# Patient Record
Sex: Female | Born: 1935 | Race: White | Hispanic: No | Smoking: Never smoker
Health system: Southern US, Community
[De-identification: ages and names within clinical notes are randomized; demographics above are authoritative.]

## PROBLEM LIST (undated history)

## (undated) DIAGNOSIS — R011 Cardiac murmur, unspecified: Secondary | ICD-10-CM

## (undated) DIAGNOSIS — R058 Other specified cough: Secondary | ICD-10-CM

## (undated) DIAGNOSIS — I8390 Asymptomatic varicose veins of unspecified lower extremity: Secondary | ICD-10-CM

## (undated) DIAGNOSIS — J42 Unspecified chronic bronchitis: Secondary | ICD-10-CM

## (undated) DIAGNOSIS — M1990 Unspecified osteoarthritis, unspecified site: Secondary | ICD-10-CM

## (undated) DIAGNOSIS — Z8679 Personal history of other diseases of the circulatory system: Secondary | ICD-10-CM

## (undated) DIAGNOSIS — N183 Chronic kidney disease, stage 3 unspecified: Secondary | ICD-10-CM

## (undated) DIAGNOSIS — G518 Other disorders of facial nerve: Secondary | ICD-10-CM

## (undated) DIAGNOSIS — I10 Essential (primary) hypertension: Secondary | ICD-10-CM

## (undated) DIAGNOSIS — E210 Primary hyperparathyroidism: Secondary | ICD-10-CM

## (undated) DIAGNOSIS — H3530 Unspecified macular degeneration: Secondary | ICD-10-CM

## (undated) DIAGNOSIS — I482 Chronic atrial fibrillation, unspecified: Secondary | ICD-10-CM

## (undated) DIAGNOSIS — E039 Hypothyroidism, unspecified: Secondary | ICD-10-CM

## (undated) DIAGNOSIS — K219 Gastro-esophageal reflux disease without esophagitis: Secondary | ICD-10-CM

## (undated) DIAGNOSIS — M810 Age-related osteoporosis without current pathological fracture: Secondary | ICD-10-CM

## (undated) DIAGNOSIS — J209 Acute bronchitis, unspecified: Secondary | ICD-10-CM

## (undated) DIAGNOSIS — E041 Nontoxic single thyroid nodule: Secondary | ICD-10-CM

## (undated) DIAGNOSIS — R05 Cough: Secondary | ICD-10-CM

## (undated) DIAGNOSIS — I739 Peripheral vascular disease, unspecified: Secondary | ICD-10-CM

## (undated) DIAGNOSIS — I493 Ventricular premature depolarization: Secondary | ICD-10-CM

## (undated) DIAGNOSIS — Z86718 Personal history of other venous thrombosis and embolism: Secondary | ICD-10-CM

## (undated) DIAGNOSIS — M419 Scoliosis, unspecified: Secondary | ICD-10-CM

## (undated) DIAGNOSIS — K635 Polyp of colon: Secondary | ICD-10-CM

## (undated) HISTORY — PX: REVERSE SHOULDER ARTHROPLASTY: SHX5054

## (undated) HISTORY — DX: Essential (primary) hypertension: I10

## (undated) HISTORY — PX: TOTAL HIP ARTHROPLASTY: SHX124

## (undated) HISTORY — DX: Nontoxic single thyroid nodule: E04.1

## (undated) HISTORY — DX: Scoliosis, unspecified: M41.9

## (undated) HISTORY — DX: Other disorders of facial nerve: G51.8

## (undated) HISTORY — DX: Asymptomatic varicose veins of unspecified lower extremity: I83.90

## (undated) HISTORY — DX: Hypothyroidism, unspecified: E03.9

## (undated) HISTORY — PX: CATARACT EXTRACTION, BILATERAL: SHX1313

## (undated) HISTORY — DX: Chronic atrial fibrillation, unspecified: I48.20

## (undated) HISTORY — DX: Ventricular premature depolarization: I49.3

## (undated) HISTORY — DX: Personal history of other diseases of the circulatory system: Z86.79

## (undated) HISTORY — DX: Polyp of colon: K63.5

## (undated) HISTORY — DX: Primary hyperparathyroidism: E21.0

## (undated) HISTORY — DX: Age-related osteoporosis without current pathological fracture: M81.0

## (undated) HISTORY — DX: Gastro-esophageal reflux disease without esophagitis: K21.9

## (undated) HISTORY — DX: Unspecified macular degeneration: H35.30

---

## 1993-12-13 ENCOUNTER — Encounter: Payer: Self-pay | Admitting: Gastroenterology

## 1995-09-02 ENCOUNTER — Encounter: Payer: Self-pay | Admitting: Gastroenterology

## 1998-01-10 ENCOUNTER — Other Ambulatory Visit: Admission: RE | Admit: 1998-01-10 | Discharge: 1998-01-10 | Payer: Self-pay | Admitting: Obstetrics and Gynecology

## 1998-03-31 ENCOUNTER — Emergency Department (HOSPITAL_COMMUNITY): Admission: EM | Admit: 1998-03-31 | Discharge: 1998-03-31 | Payer: Self-pay | Admitting: Emergency Medicine

## 1998-04-07 ENCOUNTER — Emergency Department (HOSPITAL_COMMUNITY): Admission: EM | Admit: 1998-04-07 | Discharge: 1998-04-07 | Payer: Self-pay | Admitting: Emergency Medicine

## 1999-01-17 ENCOUNTER — Other Ambulatory Visit: Admission: RE | Admit: 1999-01-17 | Discharge: 1999-01-17 | Payer: Self-pay | Admitting: Obstetrics and Gynecology

## 1999-02-28 ENCOUNTER — Encounter: Payer: Self-pay | Admitting: Orthopedic Surgery

## 1999-03-01 ENCOUNTER — Encounter: Payer: Self-pay | Admitting: Orthopedic Surgery

## 1999-03-01 ENCOUNTER — Inpatient Hospital Stay (HOSPITAL_COMMUNITY): Admission: RE | Admit: 1999-03-01 | Discharge: 1999-03-06 | Payer: Self-pay | Admitting: Orthopedic Surgery

## 2000-12-10 ENCOUNTER — Encounter (INDEPENDENT_AMBULATORY_CARE_PROVIDER_SITE_OTHER): Payer: Self-pay | Admitting: *Deleted

## 2000-12-10 ENCOUNTER — Ambulatory Visit (HOSPITAL_COMMUNITY): Admission: RE | Admit: 2000-12-10 | Discharge: 2000-12-10 | Payer: Self-pay | Admitting: *Deleted

## 2002-02-19 ENCOUNTER — Other Ambulatory Visit: Admission: RE | Admit: 2002-02-19 | Discharge: 2002-02-19 | Payer: Self-pay | Admitting: Obstetrics and Gynecology

## 2003-10-20 ENCOUNTER — Encounter: Admission: RE | Admit: 2003-10-20 | Discharge: 2003-10-20 | Payer: Self-pay | Admitting: Orthopedic Surgery

## 2003-12-09 ENCOUNTER — Ambulatory Visit (HOSPITAL_COMMUNITY): Admission: RE | Admit: 2003-12-09 | Discharge: 2003-12-09 | Payer: Self-pay | Admitting: Orthopedic Surgery

## 2004-05-18 ENCOUNTER — Encounter: Admission: RE | Admit: 2004-05-18 | Discharge: 2004-05-18 | Payer: Self-pay | Admitting: Cardiology

## 2004-06-05 ENCOUNTER — Ambulatory Visit (HOSPITAL_COMMUNITY): Admission: RE | Admit: 2004-06-05 | Discharge: 2004-06-05 | Payer: Self-pay | Admitting: Cardiology

## 2005-02-19 ENCOUNTER — Encounter: Admission: RE | Admit: 2005-02-19 | Discharge: 2005-02-19 | Payer: Self-pay | Admitting: Internal Medicine

## 2005-03-28 ENCOUNTER — Other Ambulatory Visit: Admission: RE | Admit: 2005-03-28 | Discharge: 2005-03-28 | Payer: Self-pay | Admitting: Obstetrics and Gynecology

## 2005-07-25 ENCOUNTER — Encounter: Admission: RE | Admit: 2005-07-25 | Discharge: 2005-07-25 | Payer: Self-pay | Admitting: Internal Medicine

## 2005-08-01 ENCOUNTER — Encounter: Admission: RE | Admit: 2005-08-01 | Discharge: 2005-08-01 | Payer: Self-pay | Admitting: Orthopedic Surgery

## 2005-08-09 ENCOUNTER — Inpatient Hospital Stay (HOSPITAL_COMMUNITY): Admission: RE | Admit: 2005-08-09 | Discharge: 2005-08-14 | Payer: Self-pay | Admitting: Orthopedic Surgery

## 2005-09-02 ENCOUNTER — Ambulatory Visit: Payer: Self-pay | Admitting: Infectious Diseases

## 2005-09-02 ENCOUNTER — Inpatient Hospital Stay (HOSPITAL_COMMUNITY): Admission: EM | Admit: 2005-09-02 | Discharge: 2005-09-11 | Payer: Self-pay | Admitting: Orthopedic Surgery

## 2005-09-07 ENCOUNTER — Encounter: Payer: Self-pay | Admitting: Vascular Surgery

## 2005-10-23 ENCOUNTER — Ambulatory Visit: Payer: Self-pay | Admitting: Internal Medicine

## 2005-10-30 ENCOUNTER — Inpatient Hospital Stay (HOSPITAL_COMMUNITY): Admission: RE | Admit: 2005-10-30 | Discharge: 2005-11-06 | Payer: Self-pay | Admitting: Orthopedic Surgery

## 2005-10-30 ENCOUNTER — Encounter (INDEPENDENT_AMBULATORY_CARE_PROVIDER_SITE_OTHER): Payer: Self-pay | Admitting: Specialist

## 2005-11-04 ENCOUNTER — Encounter (INDEPENDENT_AMBULATORY_CARE_PROVIDER_SITE_OTHER): Payer: Self-pay | Admitting: Interventional Cardiology

## 2007-01-28 ENCOUNTER — Ambulatory Visit (HOSPITAL_COMMUNITY): Admission: RE | Admit: 2007-01-28 | Discharge: 2007-01-28 | Payer: Self-pay | Admitting: Endocrinology

## 2008-01-19 DIAGNOSIS — K219 Gastro-esophageal reflux disease without esophagitis: Secondary | ICD-10-CM

## 2008-01-19 DIAGNOSIS — M810 Age-related osteoporosis without current pathological fracture: Secondary | ICD-10-CM | POA: Insufficient documentation

## 2008-01-20 ENCOUNTER — Ambulatory Visit: Payer: Self-pay | Admitting: Gastroenterology

## 2008-01-20 DIAGNOSIS — R198 Other specified symptoms and signs involving the digestive system and abdomen: Secondary | ICD-10-CM

## 2008-01-20 DIAGNOSIS — K5900 Constipation, unspecified: Secondary | ICD-10-CM

## 2008-01-20 DIAGNOSIS — K921 Melena: Secondary | ICD-10-CM

## 2008-02-02 ENCOUNTER — Ambulatory Visit (HOSPITAL_COMMUNITY): Admission: RE | Admit: 2008-02-02 | Discharge: 2008-02-02 | Payer: Self-pay | Admitting: Endocrinology

## 2008-02-24 ENCOUNTER — Ambulatory Visit: Payer: Self-pay | Admitting: Gastroenterology

## 2009-03-14 ENCOUNTER — Ambulatory Visit (HOSPITAL_COMMUNITY): Admission: RE | Admit: 2009-03-14 | Discharge: 2009-03-14 | Payer: Self-pay | Admitting: Endocrinology

## 2010-02-18 DIAGNOSIS — E210 Primary hyperparathyroidism: Secondary | ICD-10-CM

## 2010-02-18 HISTORY — DX: Primary hyperparathyroidism: E21.0

## 2010-03-16 ENCOUNTER — Ambulatory Visit (HOSPITAL_COMMUNITY)
Admission: RE | Admit: 2010-03-16 | Discharge: 2010-03-16 | Payer: Self-pay | Source: Home / Self Care | Attending: Endocrinology | Admitting: Endocrinology

## 2010-07-06 NOTE — Op Note (Signed)
[name], [name]                ACCOUNT NO.:  [id number]   MEDICAL RECORD NO.:  [id number]          PATIENT TYPE:  INP   LOCATION:  1420                         FACILITY:  [facility]   PHYSICIAN:  Ollen Gross, M.D.    DATE OF BIRTH:  04/13/1923   DATE OF PROCEDURE:  10/30/2005  DATE OF DISCHARGE:                                 OPERATIVE REPORT   PREOPERATIVE DIAGNOSIS:  Right total hip arthroplasty infection, status post  right hip resection arthroplasty.   POSTOPERATIVE DIAGNOSIS:  Right total hip arthroplasty infection, status  post right hip resection arthroplasty.   PROCEDURE:  Right total hip arthroplasty reimplantation.   SURGEON:  Ollen Gross, M.D.   ASSISTANT:  [name], P.A.   ANESTHESIA:  General.   ESTIMATED BLOOD LOSS:  1200.   DRAIN:  Hemovac x1.   REPLACEMENT:  450, Cell Saver.   COMPLICATIONS:  None.   CONDITION:  Stable to recovery.   BRIEF CLINICAL NOTE:  Ms. Loeb is an 77 year old female who has had  multiple problems in regards to her right hip.  She had a late total hip  arthroplasty infection many years postop.  Underwent resection arthroplasty  approximately 8 years ago.  She had placement of antibiotic spacer.  She has  been on IV vancomycin and oral ciprofloxacin.  She has had excellent healing  of her wound and sedimentation rate and C-reactive protein normalized after  being extremely highly elevated preoperatively.  She presents now for total  hip arthroplasty reimplantation.   PROCEDURE IN DETAIL:  After successful initiation of general anesthetic, the  patient is placed in the left lateral decubitus position with the right side  up and held with the hip positioner.  The right lower extremity is isolated  from her perineum with plastic drapes and prepped and draped in the usual  sterile fashion.  A long posterolateral incision was made with a 10 blade  through subcutaneous tissue to the level of the fascia lata, which  is  incised in line with the skin incision.  There is some hematoma present.  This fluid is sent for stat Gram stain, C&S, which ended up being negative.  The fascia lata is incised and some hematoma fluid is there, which was sent.  This is also negative.  The fascia of the vastus lateralis is incised and  the muscles elevated to show the lateral cortex of the femur.  I was able to  remove the PROSTALAC femoral component.  There was segmental defect in the  bone with significant cortical bone loss from when we did the resection  arthroplasty.  I then was able to remove the acetabular liner and the cement  from the acetabular component.  There were no acetabular defects.  We began  reaming at 47 mm, coursing in increments of two up to 57 mm, and a 56 mm  Pinnacle acetabular shell was placed in anatomic position with excellent  purchase and we had 3 additional dome screws placed, also with excellent  purchase.  The trial 36 mm neutral +4 liner is placed.   On  the femoral side, she did have a periprosthetic fracture in the interim  between the resection and this case.  There is a lot of bone that had  formed.  I excised the extra bone that would not be needed.  We identified  the distal fragment and entered the femoral canal, subsequent thoroughly  irrigating the canal.  The reaming is then performed up to 14.5 mm for  placement of a 15 mm bowed Solution stem.  I placed the trial stem,  impacting it to the appropriate level, and then placed a 36 +0 head.  We  reduced the hip with great stability.  It was felt that this would be our  best reconstructive option on the femur.  Other considerations were for a  complete proximal femoral allograft, but I felt as though the Solution would  provide our best option for this.  We subsequently opened the Solution bowed  8-inch 15 mm stem and impacted it into the femur in approximately 20 degrees  of anteversion.  We got down to the level I felt most  appropriate and placed  a trial 36 +0 head.  We went to a 36 +5 to gain our best soft tissue  tension.  There was fantastic stability with full extension, full external  rotation, 70 degrees flexion, 40 degrees adduction, 90 degrees internal  rotation and 90 degrees flexion, 70 degrees internal rotation.  By placing  the right leg on top of the left, we were within a few millimeters of  equalizing her leg length.  She was about three-quarters of an inch short  preoperatively.  I palpated the sciatic nerve, and there was no evidence of  any excess tension on the nerve.  We then took an x-ray AP and lateral to  confirm that we had excellent purchase within the femoral canal and make  sure there were no fractures.  The x-rays came back with no fractures and  with excellent canal fill.  Subsequently placed the 36 +5 head and reduced  the hip.   The greater trochanteric fragment is then reduced onto the stem.  There was  bone loss medial and lateral.  I decided in order to reattach the  trochanteric fragment with the trochanteric plate that I had to put a strut  allograft just to serve as a means to reattach the trochanter.  I fashioned  2 strut allografts to the femur and then we impacted the Zimmer trochanteric  plate onto the greater trochanter and advanced the trochanter.  One cable  was placed proximally to cable the plate to the trochanter, tibial plate  placed distally to keep the fragment on the distal aspect.  These were  placed around the allograft struts.  There was excellent purchase noted.  There was minimal movement in this construct with rotation.   The wound was then copiously irrigated with saline solution.  The fascia of  the vastus lateralis was closed with running #1 Vicryl.  The pseudocapsule  posterior is reattached to the femur with Ethibond suture.  Fascia lata is  closed over a Hemovac drain with #1 Vicryl, subcu closed with #1 and #2-0 Vicryl and skin closed with  staples.  The drain [place] hooked to suction,  incision cleaned and dried and a bulky sterile dressing applied.  She is  placed into a knee immobilizer, awakened and transported to recovery in  stable condition.      Ollen Gross, M.D.  Electronically Signed     FA/MEDQ  D:  10/30/2005  T:  10/31/2005  Job:  [id number]

## 2010-07-06 NOTE — H&P (Signed)
[name], [name]                ACCOUNT NO.:  [id number]   MEDICAL RECORD NO.:  [id number]          PATIENT TYPE:  INP   LOCATION:  1509                         FACILITY:  [facility]   PHYSICIAN:  Ollen Gross, M.D.    DATE OF BIRTH:  [birth year]   DATE OF ADMISSION:  09/02/2005  DATE OF DISCHARGE:                                HISTORY & PHYSICAL   CHIEF COMPLAINT:  Right hip infection with drainage.   HISTORY OF PRESENT ILLNESS:  The patient is an 77 year old female who is  well known to Dr. Ollen Gross and had previously undergone an I&D of a  right hip infected bursa back in June 2007.  It was found that the infection  was felt to be fairly deep.  Therefore, she was put on PICC line and IV  antibiotics.  During her postoperative course she has been following in the  office and found to have a small area at the base of her incision which  started out as an erythematous region and then eventually opened up and had  wound dehiscence with drainage.  Due to the lack of improvement and also  wound dehiscence with infection, it is felt the patient would best be served  by undergoing I&D and right hip resection arthroplasty.  The patient was  subsequently admitted to the hospital.  Please note, all the cultures have  been followed from the previous I&D surgery have failed to show any organism  at this time.   ALLERGIES:  1. PENICILLIN causes welts.  2. SULFA causes welts.  3. RELAFEN and LODINE cause welts.  4. Local reaction to PNEUMOVAX.   CURRENT MEDICATIONS:  1. Synthroid 100 mcg daily.  2. Aspirin 325 mg daily.  3. Percocet one or two every 4-6 hours as needed for pain.  4. She is on multivitamin; also, vitamin C, vitamin B12, vitamin D.  5. She is also on IV vancomycin every-other day and p.o. rifampin.   PAST MEDICAL HISTORY:  1. Reflux.  2. Osteoporosis.  3. History of pneumonia.  4. Hypothyroidism.  5. Peripheral edema.  6. Stenosis.  7. History of irregular heart  rate.  8. Lumbar scoliosis.  9. Varicose veins.  10.History of blood transfusion.   PAST SURGICAL HISTORY:  1. Recent I&D of the right hip back in June 2007.  2. Left total hip arthroplasty.  3. Right total hip arthroplasty.  4. Bilateral eyelid surgery.  5. Colonoscopy.   SOCIAL HISTORY:  Denies use of tobacco products or alcohol products.   FAMILY HISTORY:  Father deceased with history of lung cancer.  Mother  deceased with history of stroke and Alzheimer's.   REVIEW OF SYSTEMS:  GENERAL:  She has not had any fevers, chills, or night  sweats.  NEURO:  No seizure, syncope, or paralysis.  RESPIRATORY:  No  shortness of breath, productive cough, or hemoptysis.  CARDIOVASCULAR:  No  chest pain, angina, orthopnea.  GI:  No nausea, vomiting, diarrhea,  constipation.  GU:  No dysuria, hematuria, discharge.  MUSCULOSKELETAL:  Right hip wound which shows wound dehiscence at the tip  of the incision, the  distal portion, with serosanguineous drainage.   PHYSICAL EXAMINATION:  VITAL SIGNS:  Shows temperature of 98.3, pulse 68,  respirations 16, blood pressure 127/67.  GENERAL:  An 77 year old female, well-nourished, well-developed, no acute  distress.  She is alert and oriented and cooperative.  She is accompanied by  her family, seen in a hospital bed at [facility].  Very pleasant,  soft-spoken.  She is accompanied by her daughter, her minister, and her  granddaughter-in-law.  HEENT:  Normocephalic, atraumatic.  Pupils equal, round, and reactive.  Oropharynx clear.  EOMs intact.  NECK:  Supple.  CHEST:  Clear anterior and posterior chest walls.  No rhonchi, rales, or  wheezing.  HEART:  Regular rhythm with occasional ectopic beat.  S1, S2 noted.  No  rubs, thrills, palpitations, or murmurs.  ABDOMEN:  Soft, nontender, bowel sounds present.  RECTAL, BREAST, GENITALIA:  Not done, not pertinent to present illness.  EXTREMITIES:  Right hip:  Right hip incision, the upper  two-thirds is well  healed.  However, the lower third of the incision has some wound dehiscence  with some surrounding erythema.  Incision opening is about one-and-a-half  inches long.  Some serosanguineous drainage noted.  Motor intact to the  right lower extremity.  Sensation intact.   IMPRESSION:  1. Infected right total hip.  2. Reflux.  3. Osteoporosis.  4. History of pneumonia.  5. Hypothyroidism.  6. Peripheral edema.  7. Stenosis.  8. History of irregular heart rate.  9. Lumbar scoliosis.  10.History of varicose veins.  11.History of blood transfusion.   PLAN:  The patient admitted to [facility], continued on her IV  antibiotics.  Dr. Maurice March will be notified of her readmission.  Tentative  surgery in the next couple of days.  Plan for right hip resection  arthroplasty and placement of antibiotic spacer.      [name], P.A.      Ollen Gross, M.D.  Electronically Signed    ALP/MEDQ  D:  09/03/2005  T:  09/03/2005  Job:  [id number]   cc:   [name], M.D.  Fax: [phone]   [name], M.D.  Fax: [phone]

## 2010-07-06 NOTE — Discharge Summary (Signed)
[name], [name]                ACCOUNT NO.:  [id number]   MEDICAL RECORD NO.:  [id number]          PATIENT TYPE:  INP   LOCATION:  1509                         FACILITY:  [facility]   PHYSICIAN:  Ollen Gross, M.D.    DATE OF BIRTH:  [birth year]   DATE OF ADMISSION:  09/02/2005  DATE OF DISCHARGE:  09/11/2005                                 DISCHARGE SUMMARY   ADMISSION DIAGNOSES:  1. Infected right total hip.  2. Reflux.  3. Osteoporosis.  4. History of pneumonia.  5. Hypothyroidism.  6. Peripheral edema.  7. Stenosis.  8. History of irregular heart rate.  9. Lumbar scoliosis.  10.History of varicose veins.  11.History of blood transfusion.   DISCHARGE DIAGNOSES:  1. Infected right total hip, status post right hip resection arthroplasty      with placement of antibiotic-impregnated spacer.  2. Acute blood loss anemia.  3. Status post transfusion without sequelae.  4. Postoperative hypokalemia, improved.  5. Reflux.  6. Osteoporosis.  7. History of pneumonia.  8. Hypothyroidism.  9. Peripheral edema.  10.Stenosis.  11.History of irregular heart rate.  12.Lumbar scoliosis.  13.History of varicose veins.   PROCEDURE:  On September 04, 2005, right hip resection arthroplasty with  placement of antibiotic-impregnated spacer.  Surgeon was Dr. Homero Fellers Aluisio  assisted by Alexzandrew L. Perkins, P.A.-C.  Intraoperative transfusion of 3  units of packed cells.   CONSULTATIONS:  Infectious disease, Lacretia Leigh. Ninetta Lights, M.D.   HISTORY OF PRESENT ILLNESS:  [name] is an 77 year old female with an  infected right total hip arthroplasty.  She has had one debridement but has  continued hip drainage and now presents for resection arthroplasty and  placement of antibiotic-impregnated spacer.   LABORATORY DATA:  Preoperative CBC showed a hemoglobin of 9.6, hematocrit  29.4 (she had recently been in surgery).  She was given blood  intraoperatively.  Postoperative hemoglobin up to 11.3  but unfortunately  drifted back down to 7.4 after surgery.  She was given blood again with a  post-transfusion hemoglobin of 10.4 which drifted down to 9.4.  Last H&H 9.3  and 27.9.  White count on admission was normal, and it stayed within normal  limits.  Sed rate on postoperatively was normal at 18.  PT/PTT on admission  14.6 and 34, respectively.  INR 1.1.  Serial protimes followed.  Last PT/PTT  19.5 and 1.6.  BMET on admission within normal limits.  Serial BMETs were  followed.  Potassium did drop from 4.0 to 3.2 and back up to 4.3.  Glucose  went up from 89 to 170 and back down to 93.  Amylase taken on September 05, 2005  was normal at 125, lipase low at 18.  C-reactive protein elevated at 3.0.  Vancomycin trough less than __________  .  Postoperative UA with trace  hemoglobin, trace leukocyte esterase, rare epithelials, 0-2 red, 0-2 white.  Followup UA with moderate hemoglobin, small leukocyte esterase, rare  epithelials, 3-6 white, 0-2 red.  Blood cultures x1 with no growth after  five days.  Fluid culture from the right hip showed  gram smear with no  organisms, no growth.  Stat gram stain at the time of surgery with no  organisms, no WBCs.  Urine culture x2, first on September 06, 2005 with no growth  and again on September 10, 2005 with no growth.  Anaerobic culture taken at the  time of surgery:  Smear showed no organism, no WBCs.  Culture with no  anaerobes isolated.   Venous Doppler on September 07, 2005, bilateral lower extremity duplex, with no  evidence of DVT, no superficial thrombosis or Baker's cyst.  EKG on September 08, 2005 revealed sinus rhythm with sinus arrhythmia and occasional premature  ventricular complexes; otherwise, normal, unconfirmed.  Abdominal view on  September 05, 2005 with nonspecific bowel gas pattern.   HOSPITAL COURSE:  The patient was admitted to [facility] for  draining hip.  Infectious disease was consulted.  The patient was seen by  Dr. Ninetta Lights and started on  vancomycin.  Rifampin was added along with Cipro.  Sed rate and C-reactive protein was checked.  Sed rate was normal.  C-  reactive protein was slightly elevated.  She was given blood preoperatively  on hospital day #2.  She tolerated the blood well.  It came up.  The initial  cultures before showed no organisms.  New culture and specimens were taken  at the time of surgery.  She was set up and she was kept at bedrest.  She  was still on vancomycin from her previous surgery, and Rifampin and Cipro  was added.  Hemoglobin came up to 11.3.  Her INR was normal.  She was made  n.p.o. and taken to the operating room on September 04, 2005 and above the above-  stated procedure without complications.  Did have an extensive resection,  and she did require blood, 3 more units, during the procedure.  She was  later transferred back up to the floor.  She was hypotensive on day #1, felt  to be volume and blood loss.  Monitored her pressure.  Hemoglobin was down  to 7.4.  Drain placed at the time of surgery was left in.  She was given  more blood.  Did have some fever postoperative which was followed very  closely.  Blood cultures and urine cultures were checked.  Blood cultures  proved to be negative also along with urine cultures.  Hemoglobin came back  up after blood again to 9.8.  Dressing change on day #2, incision looked  good.  Due to the extensive resection and the placement of the antibiotic  spacer, a hip abduction brace was ordered to provide added support.  [employer]  was notified, and a hip abduction brace was placed on the patient.  Once the  brace was placed, she was allowed to be up.  ID followed very closely.  She  did have some leg pain noted after surgery.  Venous Doppler was ordered  which proved to be negative.  Urine cultures remained negative.  She was  allowed to be up, touchdown weightbearing.  Discharge planning was  consulted.  By day #3 and day #4, the fevers were improving.  By day  #4, she was afebrile with no obvious source for infection.  They recommended to  continue the vancomycin and Cipro.  Discharge planning started making  arrangements.  On September 09, 2005, she noticed some fluttering with her  heart.  EKG was checked, as above.  Her Foley was left in due to the  extensive surgery  and only being touchdown weightbearing.  She was able to  go home.  It was recommended that she have the brace on while in bed.  She  may have the brace off for hygiene.  The fluttering did resolve.  By September 10, 2005, she was doing much better.  She was not getting much sleep, but  she did get up with therapy the day before and walked about 35 feet.  Discharge planning was making arrangements for home.  She had a little bit  of low potassium, so we added potassium and that improved back up to 4.3. By  September 11, 2005, she was doing well.  Arrangements were made at home.  There  was no more flutter, and she was discharged.   DISPOSITION:  The patient was discharged home on September 11, 2005.   DISCHARGE MEDICATIONS:  1. Nu-Iron.  2. Percocet.  3. Robaxin.  4. Coumadin.  5. Vancomycin.  6. Cipro.   DIET:  Resume current diet.   FOLLOW UP:  The patient is to follow up next Tuesday, call for appointment.   ACTIVITY:  Home health and physical therapy nursing.  Touchdown  weightbearing with hip precautions.  Abduction brace when out of bed.  She  can wear the knee immobilizer in bed.   CONDITION ON DISCHARGE:  Stable.      [name], P.A.      Ollen Gross, M.D.  Electronically Signed    ALP/MEDQ  D:  10/24/2005  T:  10/24/2005  Job:  [id number]   cc:   Ollen Gross, M.D.  Fax: [phone]   Fransisco Hertz, M.D.  Fax: [phone]   Darius Bump, M.D.  Fax: [phone]   Lacretia Leigh. Ninetta Lights, M.D.  Fax: [phone]

## 2010-07-06 NOTE — Op Note (Signed)
[name], [name]                ACCOUNT NO.:  [id number]   MEDICAL RECORD NO.:  [id number]          PATIENT TYPE:  INP   LOCATION:  1509                         FACILITY:  [facility]   PHYSICIAN:  Ollen Gross, M.D.    DATE OF BIRTH:  September 29, 1923   DATE OF PROCEDURE:  09/04/2005  DATE OF DISCHARGE:                                 OPERATIVE REPORT   PREOPERATIVE DIAGNOSIS:  Infected right total hip arthroplasty.   POSTOPERATIVE DIAGNOSIS:  Infected right total hip arthroplasty.   PROCEDURE:  Right hip resection arthroplasty with placement of antibiotic-  impregnated spacer.   SURGEON:  Ollen Gross, M.D.   ASSISTANT:  [name], P.A.   ANESTHESIA:  General.   ESTIMATED BLOOD LOSS:  2000.   REPLACEMENT:  Three units of packed red blood cells and note that hemoglobin  was 11.5 near end of the case.   DRAIN:  Hemovac x1.   COMPLICATIONS:  None.   DISPOSITION:  To Recovery.   BRIEF CLINICAL NOTE:  Ms. Clauson is an 77 year old female with an infected  right total hip arthroplasty.  She has had 1 debridement, but has continued  hip drainage.  She presents now for resection arthroplasty with antibiotic  spacer placement.   PROCEDURE IN DETAIL:  After successful administration of a general  anesthetic, the patient was placed in the left lateral decubitus position  with the right side up and held with a hip positioner.  The right lower  extremity was isolated from her perineum with plastic drapes and prepped and  draped in the usual sterile fashion.  A long posterolateral incision is made  with a 10 blade through subcutaneous tissue to the fascia lata.  There was a  hole in the fascia with purulent drainage coming through it.  We sent this  for Gram's stain, C&S and aerobic and anaerobic cultures.  I incised the  fascia lata.  All abnormal-appearing tissue in the subcu as well as around  the fascia lata is removed.  There was a lot of hypertrophic granulation,  all  of which is removed to normal tissue.  Sciatic nerve is palpated and  protected.  The pseudocapsule was excised off the femur.  Hips was then  dislocated.  The femoral head is removed.  I inspected the femoral stem.  It  is extremely solid.  Utilizing the flexible osteotomes, I attempted to  disrupt the interface between the bone and the porous-coated portion of the  stem.  I was able to do so.  I then attempted to extract the stem and the  stem would not budge at all.  In fact, the bone stock proximally was rather  weak and there was a crack in the greater trochanter at this point.  I  subsequently decided to hold on the femur and proceed with the acetabulum.  The femur was then retracted anteriorly to gain acetabular exposure.  The  pseudocapsule and all tissue around the polyethylene had been removed.  The  polyethylene was then removed with an osteotome.  The 2 dome screws are  removed.  Using  the combination of half-inch curved osteotomes to disrupt  the edges of the interface between the bone and the prosthesis and then  using the Morland cementless osteotomes, I was able to extract the cup with  no bone loss.  We then used a curette to thoroughly remove any debris from  the cup.  I then packed it with a sponge.  I again addressed the femur.  Given that this was not moving and that the trochanter had already cracked  off, I decided to go with an extended osteotomy down the shaft.   We extended the incision distally and then cut through the fascia lata and  fascia to the vastus lateralis and then exposed the shaft of the femur.  The  drill holes were placed anterior and posterior and then I drilled down  distally until we had passed the tip of the stem.  A saw and osteotome were  used to elevate this lateral fragment.  This exposed the extent of the stem.  Although it was not porous-coated distally, the flutes of the stem all had  bony growth around them.  I carefully tried to remove  the bone from the  flutes, but her bone stock was rather weak distally and several pieces  fractured off.  We then were able to remove the stem.  I had pieced all of  the fragments back together and repaired them with multiple Dall-Miles  cables.  The construct was felt to be stable, but once again the  trochanteric fragment was not connected to the shaft fragment.   We then prepared the femur for placement of a size 1 Prostalac stem with  normal offset.  I then mixed 2 bags of Palacos cement with a gram of  vancomycin and 1.2 g of tobramycin.  We put this into the acetabulum and  cemented the Prostalac liner in placed.  It was a 42 with 32 inner diameter.  When the cement hardened, we mixed the cement for the stem.  I molded it  around the stem and then placed the stem into the canal with about 20  degrees of anteversion.  We put it at the appropriate depth in the canal.  Once again, the trochanteric landmarks were not present, so we impacted the  stem to a point where it would allow for good tension when we reduced the  hip.  Once the cement fully hardened, we placed a 32 +9 head and reduced it.  She had very good stability at the time.  We then thoroughly irrigated with  pulsatile lavage and removed any other abnormal-appearing tissue.  The  fascia of the vastus lateralis was then closed with a running #1 Vicryl.  We  then reattached the trochanter back by its soft tissue.  I did not want to  put those cables proximally, as we did not have good bone stock and I did  not want to destroy the remaining bone.  The fascia lata was then closed  over a Hemovac drain with #1 Vicryl, subcu closed #1 and 2-0 Vicryl and skin  with staples.  Drain was hooked to suction, incision cleaned and dried and a  bulky sterile dressing applied.  She was placed into a knee immobilizer,  awakened, and transported to Recovery in stable condition.      Ollen Gross, M.D. Electronically Signed      FA/MEDQ  D:  09/04/2005  T:  09/05/2005  Job:  [phone]
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
## 2010-07-06 NOTE — Discharge Summary (Signed)
[name], [name]                ACCOUNT NO.:  [id number]   MEDICAL RECORD NO.:  [id number]          PATIENT TYPE:  INP   LOCATION:  1615                         FACILITY:  [facility]   PHYSICIAN:  Ollen Gross, M.D.    DATE OF BIRTH:  16-Aug-1923   DATE OF ADMISSION:  08/09/2005  DATE OF DISCHARGE:  08/14/2005                                 DISCHARGE SUMMARY   __________   ADMISSION DIAGNOSES:  1. Infected bursa right hip.  2. Infected THA    History of irregular heart rate.  History of lumbar scoliosis.   DISCHARGE DIAGNOSES:  1. Infected right total hip, status post irrigation and debridement right      hip.  2. Infected THA.  Postoperative acute blood loss anemia.  Postoperative transfusion without sequelae.   PROCEDURE:  August 09, 2005.  Irrigation and debridement right hip.  Surgeon  Dr. Lequita Halt.  Assistant Arlyn Leak, PA-C.  Anesthesia general.   CONSULTATIONS:  Infectious Disease.   HISTORY:  [name] is a 77 year old female who underwent a right total hip  arthroplasty approximately 6 years ago.  Over the past few years she has had  pain.  She has had an intensive work up including aspirations that all have  come back negative.  She had a recent onset of severe pain over the past  month, elevated sed rate, C-reactive protein.  She was seen in the office  for the first time and sent to radiology for aspiration which did not reveal  any fluid, aspirated about 30 cc of purulent fluid the day before admission.  There were no hospital beds available that day.  She was admitted the  following day for surgery.   LABORATORY DATA:  Preoperative CBC with hemoglobin 10.6, hematocrit 32.3,  white cell count 7.5.  Postoperative hemoglobin down to 9.2 then 7.6, given  blood.  Post transfusion hemoglobin back up to 9.8 and hematocrit 29.8.  Pro  Time and PTT on admission 14.4 and 27 respectively. INR was 1.1.  BMET on  admission within normal limits.  Serial BMET's were  followed.  All  electrolytes stayed within normal limits.  Blood group/type A positive.  Wound culture on August 09, 2005 smear showed no organisms, moderate white  blood cells, no growth.  Anaerobic culture August 09, 2005 smear showed no  organisms, moderate white blood cells, no anaerobes isolated.  AFB culture  smear no acid fast bacilli seen.   CLINICAL DATA:  EKG August 09, 2005 sinus rhythm, premature supraventricular  complexes with occasional nonconsecutive premature ventricular complexes.  No previous EKG's.  Performed by Dr. Dietrich Pates.  Portable chest August 09, 2005 PICC to distal SVC.   HOSPITAL COURSE:  The patient was admitted to [facility] and taken  to the operating room and underwent the above-stated procedure without  complications.  The patient tolerated the procedure well.  She was started  on PCA and IV vancomycin per protocol and started on a daily aspirin.  She  was given p.o. and IV analgesics.  She was seen in rounds on day #1.  Hemovac drain output was still 85 per shift.  We would keep it in until it  was less than 10 cc.  Hemovac drain remained.  Recommended continued  antibiotics.  Infectious Disease was consulted.  The patient was seen in  consultation by ID.  Cultures were reviewed and all the cultures so far have  been negative.  Recommended adding Rifampin to the current vancomycin  protocol.  It was noted hemoglobin had dropped on August 11, 2005 down to 7.8.  Recommended transfusion and patient was given 2 units of blood.  Patient  tolerated the blood well and felt much better.  On the following day, August 12, 2005, cultures still remained negative.  Hemoglobin had come up to 9.8  after 2 units. Hemovac drain was left in place.  She had been weaned over to  p.o. medications and the PCA was discontinued at that time.  This was also  when the Rifampin was added, 300 b.i.d. on top of vancomycin.  The dressing  was changed on day #2.  The incision looked  good.  We left the drain in.  She was sent for a PICC line.  The PICC line was placed, confirmed by chest  x-ray.  She was recommended vancomycin 1500 mg every 48 hours for six weeks.  She would be on it for 35 more days. She was seen on rounds on August 13, 2005, doing well.  Incision looked good.  Drain had come out the evening  before.  She was tolerating her med's and wanted to go home.  Arrangements  were made for IV home care and she was discharged on August 13, 2005.   PLAN:  Patient is discharged on August 13, 2005.   DISCHARGE DIAGNOSES:  Please see above.   DISCHARGE MEDICATIONS:  Vancomycin 1500 mg every 48 hours over the next 35  days, 17 doses.  Next dose is day after discharge.   DIET:  As tolerated.   FOLLOWUP:  She is to followup in the office next Tuesday, August 20, 2005 for  wound check.   ACTIVITY:  Up as tolerated. Weight-bearing as tolerated.   DISPOSITION:  Home.   CONDITION ON DISCHARGE:  Improved postoperatively but stable and guarded  from infection standpoint.      [name], P.A.      Ollen Gross, M.D.  Electronically Signed    ALP/MEDQ  D:  09/24/2005  T:  09/24/2005  Job:  [id number]

## 2010-07-06 NOTE — Consult Note (Signed)
NAMEMARGEL, JOENS NO.:  [id number]   MEDICAL RECORD NO.:  [id number]          PATIENT TYPE:  INP   LOCATION:  1416                         [facility]   PHYSICIAN:  Ulyses Amor, MD DATE OF BIRTH:  01-08-1924   DATE OF CONSULTATION:  11/03/2005  DATE OF DISCHARGE:                                   CONSULTATION   [name] is an 77 year old white woman who has been hospitalized at  [facility] following resection of a right hip  arthroplasty due to infection.  A cardiology consultation was requested  because of periods of irregular heartbeat.   Of note, the patient has no past history of cardiac disease.  She does have  a history of irregular heartbeat.  She reports that she underwent cardiac  catheterization in August of 2006 for this problem and was found to not have  any coronary artery disease.  She was seen by Dr. Julio Sicks earlier during  her stay for periods of bradycardia.  Her rhythm was sinus, but had dropped  into the 40's.  No significant problem was identified.  She is also known to  have PVC's.   The patient is completely asymptomatic at this time.  She reports no chest  pain, tightness, heaviness, pressure, or squeezing.  Nor is there is any  dyspnea, diaphoresis, or nausea.  She does note brief periods of a rapid  heartbeat, but this causes her no problem.  She reports no dizziness,  lightheadedness, syncope, or near syncope.   The patient has a history of hypothyroidism and lumbar scoliosis.   ALLERGIES:  PENICILLIN, SULFA, RELAFEN, LODINE, PNEUMOVAX.   MEDICATIONS:  1. Warfarin.  2. Colace.  3. Levothyroxine.  4. Oxycodone/APAP.  5. Metoclopramide.  6. Methocarbamol.   SOCIAL HISTORY:  The patient neither smokes cigarettes, nor drinks alcohol.   FAMILY HISTORY:  Notable for lung cancer.   REVIEW OF SYSTEMS:  No new problems related to her head, eyes, ears, nose,  mouth, throat, lungs,  gastrointestinal system, genitourinary system, or  extremities.  There is no history of neurologic or psychiatric disorder.  No  history of fever, chills, or weight loss.   PHYSICAL EXAMINATION:  VITAL SIGNS:  Blood pressure 144/78, pulse 69 and  regular, respirations 16, temperature 98.7.  The patient's daughter was  present throughout the patient's encounter.  GENERAL:  The patient was an elderly white woman in no discomfort.  She was  alert, oriented, appropriate, and responsive.  HEENT:  Head, eyes, nose, and mouth were normal.  NECK:  Without thyromegaly or adenopathy.  Carotid pulses were palpable  bilaterally and without bruits.  HEART:  Normal S1 and S2.  There was no S3, S4, murmur, rub, or click.  Cardiac rhythm was regular.  No chest wall tenderness was noted.  LUNGS:  Clear.  ABDOMEN:  Soft and nontender.  There was no mass, hepatosplenomegaly,  bruits, distention, rebound, guarding, or rigidity.  Bowel sounds were  normal.  BREASTS:  PELVIC:  RECTAL:  Not performed as they were not pertinent to the  reason for  acute care hospitalization.  EXTREMITIES:  Examination of the right lower extremity was limited due to  the dressings.  Radial and dorsalis pedal pulses were palpable bilaterally.  NEUROLOGY:  Brief screening neurology survey was unremarkable.   Review of the rhythm strips revealed an underlying sinus rhythm with  occasional PVC's and occasional couplets.  There were runs, lasting several  minutes at a time, of atrial fibrillation with a rapid ventricular response.  These resolved spontaneously and the patient was returned to normal sinus  rhythm.  The 12-lead electrocardiogram revealed sinus rhythm with occasional  and consecutive PVC's.  The tracing was otherwise normal.   IMPRESSION:  Paroxysmal atrial fibrillation with rapid ventricular response.   RECOMMENDATIONS:  1. Serial cardiac enzymes.  2. Metoprolol.  3. Thyroid function tests.  4. Echocardiogram.   5. Additional medications to suppress atrial fibrillation.  6. Maintain potassium between 4 and 5.  7. Armanda Magic, M.D. will follow with her in the morning.      Ulyses Amor, MD  Electronically Signed     MSC/MEDQ  D:  11/04/2005  T:  11/04/2005  Job:  [id number]   cc:   Armanda Magic, M.D.  Fax: [phone]

## 2010-07-06 NOTE — Discharge Summary (Signed)
[name], [name]                ACCOUNT NO.:  [id number]   MEDICAL RECORD NO.:  [id number]          PATIENT TYPE:  INP   LOCATION:  1416                         FACILITY:  [facility]   PHYSICIAN:  Ollen Gross, M.D.    DATE OF BIRTH:  1923/07/31   DATE OF ADMISSION:  10/30/2005  DATE OF DISCHARGE:  11/06/2005                                 DISCHARGE SUMMARY   ADMISSION DIAGNOSES:  1. Right hip resection arthroplasty.  2. Reflux.  3. Osteoporosis.  4. History of pneumonia.  5. Hypothyroidism.  6. Peripheral edema.  7. Stenosis.  8. History of irregular heart rate flutter.  9. Lumbar scoliosis.  10.Varicose veins.  11.Previous history of transfusion.   DISCHARGE DIAGNOSES:  1. Right hip resection arthroplasty, status post reimplantation of right      total hip.  2. Postoperative atrial fibrillation with a rapid ventricular response.  3. Postoperative acute blood loss anemia, status post transfusion without      sequelae.  4. Postoperative hypokalemia, improved.  5. Postoperative bradycardia, improved.  6. Reflux.  7. Osteoporosis.  8. History of pneumonia.  9. Hypothyroidism.  10.Peripheral edema.  11.Stenosis.  12.History of irregular heart rate flutter.  13.Lumbar scoliosis.  14.Varicose veins.  15.Previous history of transfusion.   PROCEDURE:  On October 30, 2005, status post right total hip arthroplasty  reimplantation, surgeon Ollen Gross, M.D., assistant Alexzandrew L.  Otho Darner., anesthesia general.   CONSULTATIONS:  1. Jackie Plum, M.D., [facility].  2. Cardiology, Armanda Magic, M.D.   BRIEF HISTORY:  [name] is an 77 year old female who previously underwent  a resection arthroplasty about 6 weeks ago.  Has been treated with IV  antibiotics and now presents for reimplantation of her total knee.   LABORATORY DATA:  Preop CBC:  Hemoglobin 13.0, hematocrit 39.2, white cell  count 4.8, red cell count 4.34.  Serial CBCs were followed.   White count did  go up to 13.2, back down to 4.2.  Hemoglobin dropped postoperatively down to  7.3.  Given blood.  Post-transfusion hemoglobin back down to 9.2.  It  drifted down again, 8.3, and then again to 7.8, given blood again.  Post  transfusion back up to 9.4.  Last noted H&H 11.0 and 31.9.  PT/PTT preop  14.1 and 32, respectively, INR 1.1.  Serum pro times followed.  Last noted  PT/INR 28.2 and 2.5.  Chemistry panel on admission:  Low total protein of  5.7, elevated ALP of 166.  Remaining chemistry panel on admission all within  normal limits.  Serial BMETs were followed.  Serum potassium followed from  4.6 to 3.3, back to 4.1.  Calcium dropped from 10.2 to 8.2, back up to 8.9.  Cardiac enzymes in 5 sets taken over 3 days.  On November 01, 2005, first  set:  CK 39, CK-MB 0.5, troponin 0.02; second set taken on November 02, 2005, showed CK 31, CK-MB 0.6, troponin 0.03; third set taken on November 02, 2005, CK 30, CK-MB 0.6, troponin 0.02; fourth set taken on November 03, 2005, CK 17, CK-MB 0.7, troponin  0.02; the last set taken on November 04, 2005, CK 13, CK-MB 0.6, troponin 0.02.  TSH level taken on November 01, 2005, elevated at 5.855.  T4 and T3 taken on November 03, 2005, 6.4 and  77.2.  TSH level checked again on September 17, still elevated at 5.852.  Preop UA:  Trace leukocyte esterase, many epithelials, 0-2 white cells, rare  bacteria.  Follow-up UA on September 17:  Small leukocyte esterase, 0-2  white cells, otherwise negative.  Blood group type A positive.  Stat Gram  stain taken on October 30, 2005, at the time of surgery noted wbc's, no  organism seen.  Urine culture:  No growth.  Wound culture taken at time of  surgery:  Smear showed no organisms.  Culture:  No growth x2 days.  Anaerobic culture:  Smear showed no organisms.  Anaerobic culture showed no  anaerobes isolated.   Echo:  Overall left ventricular systolic function was normal.  Left   ventricular ejection fraction estimated at 65%.  No diagnostic evidence of  left ventricular regional wall motion.  There was trivial aortic valvular  regurgitation.  Inferior vena cava was mildly dilated.  EKGs:  EKG dated  November 01, 2005:  Marked sinus bradycardia, frequent consecutive  premature ventricular complexes, possible premature atrial complexes,  aberrant conduction, no significant change was found.  Previous EKG  confirmed by Dr. Charlton Haws.  Preop EKG September 08, 2005:  Sinus rhythm with  sinus arrhythmia with occasional premature ventricular complexes.  No change  was found when compared to August 09, 2005, confirmed by Dr. Graceann Congress.  Repeat EKG November 03, 2005:  Sinus arrhythmia with occasional and  consecutive premature complexes, possible premature atrial complexes,  aberrant conduction, no significant change was found.  Another EKG November 03, 2005:  Atrial fibrillation with rapid ventricular response and premature  aberrant conduction complexes, abnormal, unconfirmed.   HOSPITAL COURSE:  The patient was admitted to [facility] and  tolerated the procedure well, later returned to the recovery room and the  orthopedics floor.  Started on PCA and p.o. analgesia for pain control  following surgery.  Continued her IV antibiotics.  Was started on Coumadin  for DVT prophylaxis.  Did pretty well with surgery and was in some pain but  doing well on the morning of day #1.  Unfortunately, the hemoglobin through  the night was low at 7.3 and did receive blood.  Labs were pending that  morning.  Started on PCA and p.o. analgesics.  Started getting up out of  bed.  By day #2 she was doing pretty well, looking more comfortable.  Hemoglobin had come up following the transfusion to 9.2 but it was down a  little bit the next morning at 8.3.  dressing was changed and the incision  looked good.  Hemovac still had some output.  Rechecked the H&H at 2 o'clock.  Did have  a little superficial ulceration on her lateral right  thigh, covered with a Tegaderm.  Follow-up H&H was low, down to 7.8.  Recommended blood.  Given Cepacol lozenges.  Later that evening on day #2  the patient experienced some bradycardia down to 42.  A consult was called  to [facility].  They were seen in consultation by Dr. Julio Sicks.  Receiving blood, TSH and EKG was checked.  EKG showed sinus rhythm,  arrhythmia, back up to 89 with some PVCs.  A low-dose beta blocker was  started.  On the  morning of day #3 the patient was receiving blood and  getting her third unit.  She did have some runs of some fast beats and also  the bradycardia was noted from the night before.  The drain still had some  output, so we left the drain in for now.  Left the Foley in at that time,  heparin-locked the fluids.  She had been transferred down to telemetry.  Cardiac enzymes were checked as above.  Cardiac enzymes were negative.  By  day #4 she was doing well, no complaints of chest pain or shortness of  breath.  The Hemovac drain continued with a fair amount of output, 130 mL  over the past 24 hours, therefore was left in.  The wound was healing well.  Unfortunately, on the evening of day #4 the patient had developed some  atrial fibrillation with rapid response.  Covering services were notified  along with Dr. Julio Sicks.  Cardiac consult was called in.  Found to be in  some paroxysmal atrial fibrillation with rapid ventricular rate.  Labs were  ordered along with medications and it was under better control after  treatment.  By the next morning, postop day #5, pulse was well-controlled.  She was still on telemetry.  Rapid response had ended.  Foley was  discontinued also along with the fluids.  Continued with dressing changes.  The incision looked good.  The Hemovac drain was pulled at that time.  From  the therapy standpoint, the patient was initially slow to progress with  therapy because of  different issues with her heart.  Once these were under  control she started getting up better with therapy.  A 2 D echo was ordered  as above.  Negative cardiac enzymes.  Potassium was a little low.  It was  repleted with potassium supplements.  Encouraged to keep her potassium above  the level of 4.  Hypokalemia had resolved.  By day #6 pulse had been steady.  Started progressing with her physical therapy once she was feeling better,  drainage had stopped, and the incision looked very good.  Chest x-ray had  been checked and showed chronic changes with no edema.  It was felt the fact  that she was progressing with her therapy, feeling better, she would be  ready to go home the next day.  Seen in rounds on November 06, 2005, by Dr.  Lequita Halt, tolerating her medications, feeling good, no problems, was  discharged home.   DISCHARGE PLAN:  The patient was discharged home on November 06, 2005.   DISCHARGE DIAGNOSES:  Please see above.  DISCHARGE MEDICATIONS:  Percocet, Robaxin, Xanax, Coumadin, Lopressor.   DIET:  Resume home diet.   ACTIVITY:  Touchdown weightbearing, right lower extremity.  She was up  ambulating approximately 200 feet, progressing well.  Home health PT, home  health nursing, hip precautions, daily dressing changes.  May start to  shower.   FOLLOW-UP:  September 25.  Call for an appointment.   DISPOSITION:  Home.   CONDITION ON DISCHARGE:  Improved.      [name], P.A.      Ollen Gross, M.D.  Electronically Signed    ALP/MEDQ  D:  12/19/2005  T:  12/19/2005  Job:  [id number]   cc:   Fransisco Hertz, M.D.  Fax: [phone]   Darius Bump, M.D.  Fax: [phone]   Jackie Plum, M.D.   Armanda Magic, M.D.  Fax: [phone]

## 2010-07-06 NOTE — H&P (Signed)
[name], [name]                ACCOUNT NO.:  [id number]   MEDICAL RECORD NO.:  [id number]          PATIENT TYPE:  INP   LOCATION:  1615                         FACILITY:  [facility]   PHYSICIAN:  Ollen Gross, M.D.    DATE OF BIRTH:  30-Aug-1923   DATE OF ADMISSION:  08/09/2005  DATE OF DISCHARGE:                                HISTORY & PHYSICAL   DATE OF OFFICE VISIT/HISTORY AND PHYSICAL:  August 08, 2005.   DATE OF ADMISSION:  August 09, 2005.   CHIEF COMPLAINT:  Right hip pain.   HISTORY OF PRESENT ILLNESS:  Patient is a 77 year old female well known to  Dr. Trudee Grip.  She has previously undergone total hip arthroplasties  with the left one being in 1982 and the right one being in 2001.  She has  been followed up by Dr. Despina Hick in the past.  She was most recently seen for  ongoing hip pain.  She has been treated in the past for trochanteric  bursitis.  Over the past month, she has had some vague symptoms, including  night sweats, but no specific elevated fevers.  She has had pain ongoing in  the hip for some time now.  She had undergone some infectious workup by her  physician, Dr. Madison Hickman, with some elevated white count, sed rate on  an outpatient basis.  She followed back up with Dr. Lequita Halt and found to  have swelling over the right hip region.  She had undergone an aspirate in  the office and had obvious frank pus aspirated from the region of the  trochanteric bursa.  Due to the significant findings, she was admitted to  the hospital for I&D.   ALLERGIES:  PENICILLIN causes welts.  SULFA cause welts.  RELAFEN and LODINE  cause welts.  She had a local reaction to the PNEUMOVAX.   CURRENT MEDICATIONS:  Synthroid, hydrocodone, vitamin C, vitamin D, vitamin  B12, and a multivitamin.   PAST MEDICAL HISTORY:  1.  History of pneumonia.  2.  Hypothyroidism.  3.  Peripheral edema.  4.  Stenosis.  5.  History of irregular heart rate.  6.  Lumbar scoliosis.   PAST  SURGICAL HISTORY:  1.  Left total hip in 1982.  2.  Right total hip in 2001.  3.  Bilateral eyelid surgery in 2000.  4.  Colonoscopy in December, 2000.   SOCIAL HISTORY:  Denies the use of tobacco products or alcohol products.   FAMILY HISTORY:  Father deceased with a history of lung cancer.  Mother  deceased with a history of stroke and Alzheimer's.   REVIEW OF SYSTEMS:  GENERAL:  She has not had any documented elevated  fevers.  She has had night sweats.  No chills.  The night sweats have been  ongoing for about four weeks now.  RESPIRATORY:  No shortness of breath,  productive cough, or hemoptysis.  CARDIOVASCULAR:  No chest pain, angina, or  orthopnea.  GI:  No nausea, vomiting, diarrhea, or constipation.  GU:  No  dysuria, hematuria, or discharge.  MUSCULOSKELETAL:  Right hip  with some  swelling in the lower extremity.   PHYSICAL EXAMINATION:  VITAL SIGNS:  Pulse 64, respirations 12, blood  pressure 154/64.  GENERAL:  A 77 year old thin white female, well-developed and well-nourished  in no acute distress.  She is alert, oriented and cooperative.  Extremely  pleasant at the time of exam.  Very soft spoken.  She is accompanied by her  daughter.  HEENT:  Normocephalic and atraumatic.  Pupils are round and reactive.  Oropharynx is clear.  EOMs are intact.  NECK:  Supple.  CHEST:  Clear anterior and posterior chest wall.  No rales, rhonchi or  wheezes.  HEART:  Regular rate and rhythm with occasional skipped or ectopic beat.  S1  and S2 noted.  No murmurs.  ABDOMEN:  Soft, nontender.  Bowel sounds present.  RECTAL/BREASTS/GENITALIA:  Not done.  Not pertinent to the present illness.  EXTREMITIES:  The right hip shows flexion of about 100 degrees, internal  rotation 20, external rotation 30, abduction of 30.  She does have pain with  internal rotation.  She is tender in and about the right trochanteric region  of the hip with some swelling.   IMPRESSION:  Right hip pain,  infected bursa.   PLAN:  Patient admitted to [facility] to undergo an I&D of the  right hip region.  Surgery will be performed by Dr. Trudee Grip.      [name], P.A.      Ollen Gross, M.D.  Electronically Signed    ALP/MEDQ  D:  08/13/2005  T:  08/13/2005  Job:  [id number]   cc:   Darius Bump, M.D.  Fax: [phone]

## 2010-07-06 NOTE — H&P (Signed)
[name], [name]                ACCOUNT NO.:  [id number]   MEDICAL RECORD NO.:  [id number]          [name] TYPE:  INP   LOCATION:  NA                           FACILITY:  [facility]   PHYSICIAN:  Ollen Gross, M.D.    DATE OF BIRTH:  1923/04/08   DATE OF ADMISSION:  10/30/2005  DATE OF DISCHARGE:                                HISTORY & PHYSICAL   DATE OF OFFICE VISIT/HISTORY AND PHYSICAL:  October 18, 2005.   DATE OF ADMISSION:  October 30, 2005.   CHIEF COMPLAINT:  Previous right hip resection.   HISTORY OF PRESENT ILLNESS:  [name] is an 77 year old female, well known to  Dr. Trudee Grip, previously underwent a resection arthroplasty of [name] right  hip due to infection.  [name] has been treated with IV vancomycin, Rifampin,  and also Cipro on an outpatient basis.  [name] has completed [name] antibiotic  course.  Labs have been followed.  [name] white count is down, and [name] C-  reactive protein and also sed rate have all returned to normal limits.  The  infection has been eradicated through treatment, and at this point, [name] is  ready to be readmitted for reimplantation arthroplasty.  Risks and benefits  have been discussed, and [name] has elected to proceed with surgery.   ALLERGIES:  1. PENICILLIN causes welts.  2. SULFA causes welts.  3. RELAFEN and LODINE cause welts.  4. [name] had a local reaction to a PNEUMOVAX  INJECTION.   CURRENT MEDICATIONS:  1. Synthroid 100 mcg daily.  2. Coumadin, which was stopped prior to surgery.  3. Percocet p.r.n. pain.  4. [name] has also been on Cipro.   PAST MEDICAL HISTORY:  1. Reflux disease.  2. Osteoporosis.  3. History of pneumonia.  4. Hypothyroidism..  5. Peripheral edema.  6. Stenosis.  7. History of irregular heart rate/flutter.  8. Lumbar scoliosis.  9. Varicose veins.  10.History of blood transfusion.   PAST SURGICAL HISTORY:  1. I&D, right hip, back in June, 2007.  2. Resection, hip arthroplasty, July, 2007.  3. Left total  hip.  4. Right total hip.  5. Bilateral eyelid surgery.  6. Colonoscopy.   SOCIAL HISTORY:  Denies the use of tobacco products or alcohol products.   FAMILY HISTORY:  Father deceased with a history of lung cancer.  Mother  deceased with a history of stroke and Alzheimer's.   REVIEW OF SYSTEMS:  GENERAL:  No fevers, chills, night sweats.  NEURO:  No  seizures, syncope, paralysis.  RESPIRATORY:  No shortness of breath,  productive cough, or hemoptysis.  CARDIOVASCULAR:  No chest pain, angina, or  orthopnea.  GI:  No nausea, vomiting, diarrhea, or constipation.  GU:  No  dysuria, hematuria, or discharge.  MUSCULOSKELETAL:  Right knee, found in  the history of present illness.   PHYSICAL EXAMINATION:  VITAL SIGNS:  Pulse 64, respirations 12, blood  pressure 136/70.  GENERAL:  An 77 year old white female, well-developed and well-nourished in  no acute distress.  [name] is alert, oriented and cooperative.  [name] is  accompanied by [name] family, [name]  daughter and son-in-law.  Very pleasant at  the time of exam.  HEENT:  Normocephalic and atraumatic.  Pupils are round and reactive.  Oropharynx is clear.  EOMs intact.  NECK:  Supple.  CHEST:  Clear anterior and posterior chest wall.  No rales, rhonchi or  wheezes.  HEART:  Regular rate and rhythm.  S1 and S2 noted.  No rubs, thrills,  palpitations, or murmurs.  ABDOMEN:  Soft, nontender.  Bowel sounds present.  RECTAL/BREASTS/GENITALIA:  Not done.  Not pertinent to the present illness.  EXTREMITIES:  Right hip:  Previous incision is well healed.  [name] has a right  hip abduction brace with leg extension and foot plate.  Motor function is  intact.  [name] moves the toes well on exam.   IMPRESSION:  1. Right hip resection arthroplasty.  2. Reflux.  3. Osteoporosis.  4. History of pneumonia.  5. Hypothyroidism.  6. Peripheral edema.  7. Stenosis.  8. History of irregular heart rate/flutter.  9. Lumbar scoliosis.  10.Varicose veins.  11.Previous  history of transfusion.   PLAN:  [name] admitted to [facility] to undergo a reimplantation  arthroplasty of [name] right total hip.  Surgery will be performed by Dr. Trudee Grip.      [name], P.A.      Ollen Gross, M.D.  Electronically Signed    ALP/MEDQ  D:  10/29/2005  T:  10/29/2005  Job:  [id number]   cc:   Darius Bump, M.D.  Fax: [phone]   Fransisco Hertz, M.D.  Fax: [phone]

## 2010-07-06 NOTE — Op Note (Signed)
[name], [name]                ACCOUNT NO.:  [id number]   MEDICAL RECORD NO.:  [id number]          PATIENT TYPE:  AMB   LOCATION:  DAY                          FACILITY:  [facility]   PHYSICIAN:  Ollen Gross, M.D.    DATE OF BIRTH:  07-Apr-1923   DATE OF PROCEDURE:  08/09/2005  DATE OF DISCHARGE:                                 OPERATIVE REPORT   PREOPERATIVE DIAGNOSIS:  Infected right total hip arthroplasty.   POSTOPERATIVE DIAGNOSIS:  Infected right total hip arthroplasty.   PROCEDURE:  Irrigation and debridement of the right hip.   SURGEON:  Ollen Gross, M.D.   ASSISTANT:  Jamelle Rushing, P.A.-C.   ANESTHESIA:  General.   ESTIMATED BLOOD LOSS:  300.   DRAINS:  Hemovac x1.   COMPLICATIONS:  None.   CONDITION:  Stable to the recovery room.   BRIEF CLINICAL NOTE:  [name] is a 77 year old female who underwent a  right total hip arthroplasty approximately six years ago.  For the past few  years, she has had pain.  She had an intensive workup including aspirations  and all have come back negative.  She has had a recent onset of severe pain  in the past month.  She has had elevated sed rate and C-reactive protein.  I  just saw her in the office for the first time.  She had been sent to  radiology for the aspiration which did not reveal any fluid.  I was able to  aspirate about 30 mL of purulent fluid yesterday.  There were no hospital  beds available, thus she is admitted via the operating room today.   PROCEDURE IN DETAIL:  After the successful administration of general  anesthetic, the patient was placed in the left lateral decubitus position  with the right side up and the hip in the hip positioner.  The right lower  extremity was isolated from the perineum with plastic drapes and prepped and  draped in the usual sterile fashion.  A posterolateral incision is made with  a 10 blade through subcutaneous tissue.  There is pus encountered in the  subcu tissue.  As soon  as I incised the fascia, a copious amount of purulent  fluid was identified.  It is sent for Gram stain, C&S, aerobic, anaerobic  cultures.  This was communicating all the way down to the joint.  There was  also a tremendous amount of hypertrophic abnormal appearing granulation  tissue.  We were able to suction out all of the pus.  Once we obtained the  cultures, then we started infusing a grams of IV vancomycin.  I did a  thorough debridement of all the abnormal appearing granulation tissue.  I  inspected the joint was able to clean the abnormal tissue from the joint,  also.  The sciatic nerve had been palpated and protected throughout the  procedure.  There were several pockets of pus within the granulation tissue  which were thoroughly debrided when I excised tissue.  We finally got it  back to normal appearing tissue throughout the joint and periprosthetic  tissues.  There was no obvious loosening of the metal components.  I elected  not to change the acetabular liner as I felt as though that this procedure  was going to be a precursor for a probable eventual resection arthroplasty.  The wound was then copiously irrigated with 3 liters of saline solution with  pulsatile lavage.  The tissues are again inspected and had a normal  appearance.  I then irrigated again and closed the fascia lata over a  Hemovac drain with interrupted #1 Vicryl.  The subcu was closed #1 and 2-0  Vicryl and the skin with staples.  The drain was hooked to suction.  The  incision was cleaned and dried and a bulky sterile dressing applied.  She is  awakened and transferred to the recovery room in stable condition.      Ollen Gross, M.D.  Electronically Signed     FA/MEDQ  D:  08/09/2005  T:  08/09/2005  Job:  [id number]

## 2010-07-06 NOTE — Consult Note (Signed)
[name], [name]                ACCOUNT NO.:  [id number]   MEDICAL RECORD NO.:  [id number]          PATIENT TYPE:  INP   LOCATION:  1416                         FACILITY:  [facility]   PHYSICIAN:  Jackie Plum, M.D.DATE OF BIRTH:  08-Mar-1923   DATE OF CONSULTATION:  11/02/2005  DATE OF DISCHARGE:                                   CONSULTATION   REASON FOR CONSULTATION:  Bradycardia.   PROBLEM LIST:  1. Abnormal EKG with sinus arrhythmias with PVCs.  2. Postoperative anemia of blood loss.  3. History of hypothyroidism.  4. 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  5. History of irregular heart rhythm.  6. History of lumbar scoliosis.  7. History of allergy to PENICILLIN, SULFA, RELAFEN, LODINE, AND      PNEUMOVAX.   RECOMMENDATIONS:  1. Continue with packed red blood cells transfusion to the target of about      9 to 10.  2. Place patient on telemetry and follow EKG, check TSH, and cycle the      patient's cardiac enzymes.   COMMENTS:  The patient was seen and evaluated by me, medical records  including E-chart were reviewed.  The patient's current medications were all  noted as per medication records.   HISTORY OF PRESENT ILLNESS:  She is an 77 year old lady who was admitted for  the orthopedic procedure as listed above.  The patient apparently lost quite  a significant amount of blood interoperatively and needed blood transfusion  which  has been ongoing.  However, she was noted to be bradycardic with  heart rate of 47 beats per minute and we are asked to evaluate for  admission.  The patient did not have any distant or current chest pain,  shortness of breath, or dizziness.  She denies any heat or cold intolerance.  No nausea and vomiting.  No diaphoresis.  No PND, orthopnea, or  palpitations.  We are asked to see her for bradycardia.   PAST MEDICAL HISTORY:  As noted above.   CURRENT MEDICATIONS:  Noted.   SOCIAL HISTORY:  The patient  does not smoke cigarettes or drink alcohol.   FAMILY HISTORY:  Positive for lung cancer.   REVIEW OF SYMPTOMS:  As noted above.   PHYSICAL EXAMINATION:  VITAL SIGNS:  Blood pressure 124/55, pulse 57, respirations 20, temperature  97.7, temperature 99.4.  Of note, the patient's heart rate was documented to  be 43 and 44 at 2120 hours and 2150 hours respectively.  GENERAL:  The patient was comfortable not in anxious.  She was alert and  appropriate.  HEENT:  She has scleral pallor with icterus.  Oropharynx moist.  NECK:  Supple, no JVD.  LUNGS:  No crackles.  CARDIAC:  The patient had regular rhythm with extra systole.  No murmur or  rub.  ABDOMEN:  Markedly obese, soft, nontender, bowel sounds present.  EXTREMITIES:  Trace edema noted, no cyanosis.  CNS:  Nonfocal.   LABORATORY DATA:  The patient's lab work was reviewed and, of note,  hemoglobin was 7.8.  INR was 1.6.  Chemistries were noted  and, of note,  glucose was 151 with normal BUN and creatinine of 6 and 0.9 respectively.  Alkaline phosphatase was 156 with normal AST and ALT.   Thank you for this consultation.  The patient will need post transfusion  CBC.  Will follow up the patient's TSH and EKG.  The patient's 12 lead EKG  reviewed showed sinus arrhythmia at 89 beats per minute with PVCs.  The  patient may benefit from a low dose beta blocker if she continues to have a  lot of PVCs which she will need a check of her magnesium and phosphorous, as  well.      Jackie Plum, M.D.  Electronically Signed     GO/MEDQ  D:  11/02/2005  T:  11/02/2005  Job:  [id number]

## 2010-07-24 ENCOUNTER — Encounter: Payer: Self-pay | Admitting: Pulmonary Disease

## 2010-07-26 ENCOUNTER — Encounter: Payer: Self-pay | Admitting: Pulmonary Disease

## 2010-07-26 ENCOUNTER — Ambulatory Visit (INDEPENDENT_AMBULATORY_CARE_PROVIDER_SITE_OTHER): Payer: Medicare Other | Admitting: Pulmonary Disease

## 2010-07-26 VITALS — BP 138/80 | HR 63 | Temp 97.7°F | Ht 63.0 in | Wt 163.6 lb

## 2010-07-26 DIAGNOSIS — R05 Cough: Secondary | ICD-10-CM

## 2010-07-26 NOTE — Patient Instructions (Signed)
Stop symbicort/dulera inhalers. Take chlorpheniramine 8mg  over the counter each night at bedtime. Use Qnasal nasal spray 2 each nostril each am.  Do not sniff while taking Take prilosec every am, and then take nexium samples before dinner for the next 2 weeks.   followup with me in 3 weeks.  Will check breathing tests next visit.

## 2010-07-26 NOTE — Progress Notes (Signed)
  Subjective:    Patient ID: YOUA DELONEY, female    DOB: 1924-01-27, 77 y.o.   MRN: [id number]  HPI The pt is an 77y/o female who I have been asked to see for chronic cough.  The pt states this has been a problem for her for 78yrs, and usually occurs in spring and fall with breaks in between.  It starting this time around in DEC of last year, and is usually dry and hacking in nature.  It is worse with conversation, and can come in paroxysms which can lead to near vomiting.  She denies a tickle in her throat or frequent throat clearing.  She denies PND, sinus pressure, but does describe rhinorrhea at times.  She has a h/o GERD for which she is on a PPI, but has breakthru symptoms at times.  Currently, her cough is only occurring periodically.  She would rate at 4/10 at this time.  She denies any h/o asthma, and has never had spirometry.  Her last cxr was in DEC, and she tells me it was OK.  She has been tried on symbicort, and is unsure if it has helped.  She tells me the cough "usually wears itself out".     Review of Systems  Constitutional: Negative for fever and unexpected weight change.  HENT: Negative for ear pain, nosebleeds, congestion, sore throat, rhinorrhea, sneezing, trouble swallowing, dental problem, postnasal drip and sinus pressure.   Eyes: Negative for redness and itching.  Respiratory: Negative for chest tightness, shortness of breath and wheezing.   Cardiovascular: Negative for palpitations and leg swelling.  Gastrointestinal: Negative for nausea and vomiting.  Genitourinary: Negative for dysuria.  Musculoskeletal: Negative for joint swelling.  Skin: Negative for rash.  Neurological: Negative for headaches.  Hematological: Does not bruise/bleed easily.  Psychiatric/Behavioral: Negative for dysphoric mood. The patient is not nervous/anxious.        Objective:   Physical Exam Constitutional:  Well developed, no acute distress  HENT:  Nares patent without discharge, but  erythematous mucosa bilat  Oropharynx without exudate, palate and uvula are normal  Eyes:  Perrla, eomi, no scleral icterus  Neck:  No JVD, no TMG  Cardiovascular:  Normal rate, slightly irregular rhythm, no rubs or gallops.  No murmurs        Intact distal pulses  Pulmonary :  Normal breath sounds, no stridor or respiratory distress   No rales, rhonchi, or wheezing  Abdominal:  Soft, nondistended, bowel sounds present.  No tenderness noted.   Musculoskeletal:  minimal lower extremity edema noted.  Lymph Nodes:  No cervical lymphadenopathy noted  Skin:  No cyanosis noted  Neurologic:  Alert, appropriate, moves all 4 extremities without obvious deficit.         Assessment & Plan:

## 2010-07-26 NOTE — Assessment & Plan Note (Addendum)
The pt has a chronic cough of 2yrs duration that is of intermittent severity.  She feels it is usually at its worse in spring and fall, and this typically suggests postnasal drip/rhinitis as an etiology.  At this point, her cough is more likely to be upper airway in origin than lower, and would like to treat aggressively for postnasal drip and reflux (the most common causes of upper airway cough).  I suspect her cough also has a cyclical component to it as well, which can explain the wide variability of severity.

## 2010-08-15 ENCOUNTER — Encounter: Payer: Self-pay | Admitting: Pulmonary Disease

## 2010-08-17 ENCOUNTER — Encounter: Payer: Self-pay | Admitting: Pulmonary Disease

## 2010-08-17 ENCOUNTER — Ambulatory Visit (INDEPENDENT_AMBULATORY_CARE_PROVIDER_SITE_OTHER): Payer: Medicare Other | Admitting: Pulmonary Disease

## 2010-08-17 VITALS — BP 104/72 | HR 71 | Temp 97.8°F | Ht 63.0 in | Wt 163.6 lb

## 2010-08-17 DIAGNOSIS — R053 Chronic cough: Secondary | ICD-10-CM

## 2010-08-17 DIAGNOSIS — R05 Cough: Secondary | ICD-10-CM

## 2010-08-17 NOTE — Patient Instructions (Signed)
Will need to see how you do now that the cough is almost gone.  Will cut back on some of your medications, but if your symptoms return, may have to go back on some of these.   Would change the chlorpheniramine to as needed at bedtime rather than everynight.  [name] based on whether you are having postnasal drip or ongoing allergy symptoms. Change back to just prilosec in the mornings, as you were doing before I saw you.   The key will be to not let the cough escalate to its prior level.  If the cough worsens, please let me know.

## 2010-08-17 NOTE — Assessment & Plan Note (Addendum)
The pt's cough is 90+% better with treatment of PND and LPR.  Her spirometry today is totally normal The question at this point is what chronic meds we need to leave her on vs. Prn.  I have asked her to try the antihistamine as needed, and to go back to her prior reflux regimen.  If she sees return of her cough, may have to leave her on more aggressive meds chronically.

## 2010-08-17 NOTE — Progress Notes (Signed)
  Subjective:    Patient ID: [name], female    DOB: 1923-06-27, 77 y.o.   MRN: [id number]  HPI The pt comes in today for f/u of her chronic cough.  At the last visit, she was felt to have cough secondary to postnasal drip and /or reflux with cyclical mechanism.  She was treated for AR and more aggressively for reflux, and returns today.  She states her cough is 90+% better, and feels much better.  She has not been on her inhaler meds, and is due for spirometry today.    Review of Systems  Constitutional: Negative for fever and unexpected weight change.  HENT: Positive for postnasal drip. Negative for ear pain, nosebleeds, congestion, sore throat, rhinorrhea, sneezing, trouble swallowing, dental problem and sinus pressure.   Eyes: Negative for redness and itching.  Respiratory: Negative for cough, chest tightness, shortness of breath and wheezing.   Cardiovascular: Negative for palpitations and leg swelling.  Gastrointestinal: Negative for nausea and vomiting.  Genitourinary: Negative for dysuria.  Musculoskeletal: Negative for joint swelling.  Skin: Negative for rash.  Neurological: Negative for headaches.  Hematological: Does not bruise/bleed easily.  Psychiatric/Behavioral: Negative for dysphoric mood. The patient is not nervous/anxious.        Objective:   Physical Exam Wd female in nad No purulence or discharge noted from nares Chest clear to auscultation Cor with rrr LE with minimal edema, no cyanosis  Alert, oriented,moves all 4        Assessment & Plan:

## 2010-11-07 ENCOUNTER — Ambulatory Visit (INDEPENDENT_AMBULATORY_CARE_PROVIDER_SITE_OTHER): Payer: Medicare Other | Admitting: Pulmonary Disease

## 2010-11-07 ENCOUNTER — Encounter: Payer: Self-pay | Admitting: Pulmonary Disease

## 2010-11-07 VITALS — BP 144/86 | HR 67 | Temp 97.8°F | Ht 63.0 in | Wt 167.4 lb

## 2010-11-07 DIAGNOSIS — R05 Cough: Secondary | ICD-10-CM

## 2010-11-07 NOTE — Progress Notes (Signed)
  Subjective:    Patient ID: [name], female    DOB: 1923/09/17, 77 y.o.   MRN: [id number]  HPI The patient comes in today for an acute sick visit related to cough.  She was last seen in June where her cough had resolved with more aggressive treatment for postnasal drip and reflux disease.  I also felt she had a cyclical component to her cough, and she responded to behavioral therapies.  The patient states that she has done well until most recently when her cough has just restarted.  It is fairly mild, and does not produce mucus.  The patient feels this is coming from her throat area.  She is having some postnasal drip, but denies worsening reflux symptoms.   Review of Systems  Constitutional: Negative for fever and unexpected weight change.  HENT: Positive for rhinorrhea. Negative for ear pain, nosebleeds, congestion, sore throat, sneezing, trouble swallowing, dental problem, postnasal drip and sinus pressure.   Eyes: Positive for itching. Negative for redness.  Respiratory: Positive for cough and shortness of breath. Negative for chest tightness and wheezing.   Cardiovascular: Positive for leg swelling. Negative for palpitations.  Gastrointestinal: Negative for nausea and vomiting.  Genitourinary: Negative for dysuria.  Musculoskeletal: Negative for joint swelling.  Skin: Negative for rash.  Neurological: Negative for headaches.  Hematological: Does not bruise/bleed easily.  Psychiatric/Behavioral: Negative for dysphoric mood. The patient is not nervous/anxious.        Objective:   Physical Exam Overweight female in no acute distress Nose without purulent discharge, no significant inflammation. Oropharynx clear Chest totally clear to auscultation, no wheezes or rhonchi Cardiac exam with regular rate and rhythm Lower extremities with no significant edema, no cyanosis noted Alert and oriented, moves all 4 extremities.       Assessment & Plan:

## 2010-11-07 NOTE — Patient Instructions (Signed)
Try getting back on chlorpheniramine over the counter 8mg  at bedtime. Try taking prilosec at bedtime rather than in am Do not overuse your voice, no throat clearing, and can try hard candy again during the day to soothe back of throat until you are better.  Please call if cough worsens, or if it does not resolve.

## 2010-11-07 NOTE — Assessment & Plan Note (Signed)
The patient had total resolution of her prior cough with treatment of postnasal drip and more aggressive treatment of reflux disease.  Her cough most recently has returned, and again I think this is upper airway in origin.  I have asked her to get back on chlorpheniramine at bedtime, and also asked her to move her Prilosec to bedtime instead of in the a.m.  I've also reviewed again the behavioral techniques that help with upper airway cough.  She is to let me know if her cough does not improve or if it worsens.

## 2011-04-05 DIAGNOSIS — H40019 Open angle with borderline findings, low risk, unspecified eye: Secondary | ICD-10-CM | POA: Diagnosis not present

## 2011-04-16 DIAGNOSIS — H348392 Tributary (branch) retinal vein occlusion, unspecified eye, stable: Secondary | ICD-10-CM | POA: Diagnosis not present

## 2011-04-16 DIAGNOSIS — H353190 Nonexudative age-related macular degeneration, unspecified eye, stage unspecified: Secondary | ICD-10-CM | POA: Diagnosis not present

## 2011-04-16 DIAGNOSIS — H43819 Vitreous degeneration, unspecified eye: Secondary | ICD-10-CM | POA: Diagnosis not present

## 2011-04-16 DIAGNOSIS — H35369 Drusen (degenerative) of macula, unspecified eye: Secondary | ICD-10-CM | POA: Diagnosis not present

## 2011-04-17 DIAGNOSIS — R05 Cough: Secondary | ICD-10-CM | POA: Diagnosis not present

## 2011-04-17 DIAGNOSIS — R040 Epistaxis: Secondary | ICD-10-CM | POA: Diagnosis not present

## 2011-04-17 DIAGNOSIS — K219 Gastro-esophageal reflux disease without esophagitis: Secondary | ICD-10-CM | POA: Diagnosis not present

## 2011-04-17 DIAGNOSIS — J310 Chronic rhinitis: Secondary | ICD-10-CM | POA: Diagnosis not present

## 2011-04-22 DIAGNOSIS — Z1231 Encounter for screening mammogram for malignant neoplasm of breast: Secondary | ICD-10-CM | POA: Diagnosis not present

## 2011-04-25 ENCOUNTER — Encounter: Payer: Self-pay | Admitting: Pulmonary Disease

## 2011-04-25 ENCOUNTER — Ambulatory Visit (INDEPENDENT_AMBULATORY_CARE_PROVIDER_SITE_OTHER): Payer: Medicare Other | Admitting: Pulmonary Disease

## 2011-04-25 VITALS — BP 120/68 | HR 70 | Temp 97.8°F | Ht 63.0 in | Wt 169.2 lb

## 2011-04-25 DIAGNOSIS — R05 Cough: Secondary | ICD-10-CM

## 2011-04-25 DIAGNOSIS — J209 Acute bronchitis, unspecified: Secondary | ICD-10-CM | POA: Insufficient documentation

## 2011-04-25 HISTORY — DX: Acute bronchitis, unspecified: J20.9

## 2011-04-25 MED ORDER — AZITHROMYCIN 250 MG PO TABS: ORAL_TABLET | ORAL | Status: AC

## 2011-04-25 MED ORDER — BENZONATATE 100 MG PO CAPS: 100.0000 mg | ORAL_CAPSULE | Freq: Four times a day (QID) | ORAL | Status: AC | PRN

## 2011-04-25 NOTE — Patient Instructions (Signed)
Will treat with zpak for your acute bronchitis Try mucinex dm extra strength one in am and pm daily for next 2 weeks. Can use tessalon pearls for cough as directed.  Please call me if your cough does not improve.

## 2011-04-25 NOTE — Progress Notes (Signed)
  Subjective:    Patient ID: [name], female    DOB: September 21, 1923, 78 y.o.   MRN: [id number]  HPI The patient comes in today for an acute sick visit.  She has known upper airway cough syndrome, probably secondary to postnasal drip, laryngopharyngeal reflux, and cyclical coughing.  At the last visit she was having mild escalation of her cough, but responded with her usual therapy.  She comes in today with a two-day history of a cough that is deeper in her chest, and is associated with rattling congestion.  She is not having any fevers, chills, or sweats.  She does have some increased shortness of breath.  She has not been able to mobilize the mucus to see if it is purulent in nature.   Review of Systems  Constitutional: Negative for fever and unexpected weight change.  HENT: Positive for nosebleeds, rhinorrhea and sneezing. Negative for ear pain, congestion, sore throat, trouble swallowing, dental problem, postnasal drip and sinus pressure.   Eyes: Positive for itching. Negative for redness.  Respiratory: Positive for cough and wheezing. Negative for chest tightness and shortness of breath.   Cardiovascular: Positive for leg swelling. Negative for palpitations.  Gastrointestinal: Negative for nausea, vomiting and diarrhea.  Genitourinary: Negative for dysuria.  Musculoskeletal: Negative for joint swelling.  Skin: Negative for rash.  Neurological: Negative for headaches.  Hematological: Does not bruise/bleed easily.  Psychiatric/Behavioral: Negative for dysphoric mood. The patient is not nervous/anxious.        Objective:   Physical Exam Well-developed female in no acute distress Nose without purulence or discharge noted Chest totally clear to auscultation Cardiac exam with regular rate and rhythm Lower extremities with mild ankle edema, varicosities noted. Alert and oriented, moves all 4 extremities.       Assessment & Plan:

## 2011-04-25 NOTE — Assessment & Plan Note (Signed)
The patient's cough today is different than it has been in the past.  I suspect this is coming from her lower airway rather than upper.  She has rattling congestion, and her history is suggestive of an acute bronchitis.  Will treat her with an antibiotic, as well as Mucinex for better mucociliary clearance.  She is to call me if she is not getting better.

## 2011-05-09 DIAGNOSIS — H524 Presbyopia: Secondary | ICD-10-CM | POA: Diagnosis not present

## 2011-05-09 DIAGNOSIS — H538 Other visual disturbances: Secondary | ICD-10-CM | POA: Diagnosis not present

## 2011-05-09 DIAGNOSIS — H40019 Open angle with borderline findings, low risk, unspecified eye: Secondary | ICD-10-CM | POA: Diagnosis not present

## 2011-06-11 DIAGNOSIS — R609 Edema, unspecified: Secondary | ICD-10-CM | POA: Diagnosis not present

## 2011-06-18 ENCOUNTER — Ambulatory Visit (INDEPENDENT_AMBULATORY_CARE_PROVIDER_SITE_OTHER): Payer: Medicare Other | Admitting: Pulmonary Disease

## 2011-06-18 ENCOUNTER — Encounter: Payer: Self-pay | Admitting: Pulmonary Disease

## 2011-06-18 VITALS — BP 136/86 | HR 104 | Temp 97.9°F | Ht 63.0 in | Wt 166.8 lb

## 2011-06-18 DIAGNOSIS — R05 Cough: Secondary | ICD-10-CM | POA: Diagnosis not present

## 2011-06-18 NOTE — Assessment & Plan Note (Signed)
I continue to believe that her cough is more upper airway in origin than lower.  Her lungs are totally clear on exam today, and her spirometry has been normal in the past.  She is having postnasal drip, and does have a history of reflux.  She has responded very well to chlorpheniramine and b.i.d. Proton pump inhibitor in the past, and I would like to restart her on this to see if the cough resolves.  The trick will be for her to recognize cough escalation, and restart treatment on her own so the cough does not reach its peak.  She will give me feedback in 2 weeks with her response to medication changes, and if not improved, we may have to do a CT of her sinuses and methacholine challenge testing.

## 2011-06-18 NOTE — Patient Instructions (Signed)
Continue with your prilosec each am, but then take dexilant 60mg  before dinner each day for next 2 weeks. Take chlorpheniramine 8mg  one at bedtime and lunch for next 2 weeks instead of your claritin. Minimize voice use, and use hard candy during day to keep tickle minimized. No throat clearing. Please call me in 2 weeks with response to treatment.  If you are still having significant issues, we may need to xray sinuses and do special breathing tests.

## 2011-06-18 NOTE — Progress Notes (Signed)
  Subjective:    Patient ID: [name], female    DOB: 01-30-1924, 78 y.o.   MRN: [id number]  HPI The patient comes in today for an acute sick visit.  She has known chronic cough, but has responded very well to chlorpheniramine and b.i.d. Proton pump inhibitor in the past.  She was last seen in March for her cough, but this was more consistent with an acute bronchitis rather than upper airway cough as she usually has had in the past.  She recently was outside during a windy day, and her cough started escalating.  She has had no change in her treatment, and her cough has continued to escalate.  I have asked her to start on the behavioral therapies, and also treat her postnasal drip and reflux more aggressively if the cough did escalate.  Currently it is dry and nonproductive.  She is having postnasal drip because of spring allergies.   Review of Systems  Constitutional: Negative for fever and unexpected weight change.  HENT: Positive for rhinorrhea. Negative for ear pain, nosebleeds, congestion, sore throat, sneezing, trouble swallowing, dental problem, postnasal drip and sinus pressure.   Eyes: Positive for itching. Negative for redness.  Respiratory: Positive for cough. Negative for chest tightness, shortness of breath and wheezing.   Cardiovascular: Positive for leg swelling. Negative for palpitations.  Gastrointestinal: Negative for nausea and vomiting.  Genitourinary: Negative for dysuria.  Musculoskeletal: Negative for joint swelling.  Skin: Negative for rash.  Neurological: Negative for headaches.  Hematological: Does not bruise/bleed easily.  Psychiatric/Behavioral: Negative for dysphoric mood. The patient is not nervous/anxious.        Objective:   Physical Exam Overweight female in no acute distress Nose without purulence or discharge noted Oropharynx clear, but there is postnasal drip noted Chest totally clear to auscultation Cardiac exam with regular rate and rhythm Lower  extremities with mild ankle edema, no cyanosis Alert and oriented, moves all 4 extremities.       Assessment & Plan:

## 2011-07-08 DIAGNOSIS — E042 Nontoxic multinodular goiter: Secondary | ICD-10-CM | POA: Diagnosis not present

## 2011-07-08 DIAGNOSIS — E559 Vitamin D deficiency, unspecified: Secondary | ICD-10-CM | POA: Diagnosis not present

## 2011-07-08 DIAGNOSIS — I10 Essential (primary) hypertension: Secondary | ICD-10-CM | POA: Diagnosis not present

## 2011-07-08 DIAGNOSIS — E785 Hyperlipidemia, unspecified: Secondary | ICD-10-CM | POA: Diagnosis not present

## 2011-07-11 ENCOUNTER — Telehealth: Payer: Self-pay | Admitting: Pulmonary Disease

## 2011-07-11 NOTE — Telephone Encounter (Signed)
She can go back to her claritin each am to see how she does, but if increased cough or postnasal drip, could add chlorpheniramine back at 4mg  at bedtime in addition to the claritin.  dont forget to continue behavioral therapies such as not overusing voice, no throat clearing, etc.  Let us know if cough escalates again.

## 2011-07-11 NOTE — Telephone Encounter (Signed)
I spoke with pt and she was calling to give update on how she has been doing. He cough is a lot better--coughs about 1-2 times a day now and it's usually when she goes out side and her PND is much better. She has been doing the chlorpheniramine 8 mg at lunch and bedtime. Pt wanting to know if she should she still continue this or go back on the Claritin . Please advise KC thanks

## 2011-07-11 NOTE — Telephone Encounter (Signed)
I spoke with pt and is aware of KC recs. She voiced her understanding and had no questions 

## 2011-07-16 DIAGNOSIS — H348392 Tributary (branch) retinal vein occlusion, unspecified eye, stable: Secondary | ICD-10-CM | POA: Diagnosis not present

## 2011-07-16 DIAGNOSIS — H35369 Drusen (degenerative) of macula, unspecified eye: Secondary | ICD-10-CM | POA: Diagnosis not present

## 2011-07-16 DIAGNOSIS — H353190 Nonexudative age-related macular degeneration, unspecified eye, stage unspecified: Secondary | ICD-10-CM | POA: Diagnosis not present

## 2011-07-23 DIAGNOSIS — I10 Essential (primary) hypertension: Secondary | ICD-10-CM | POA: Diagnosis not present

## 2011-07-23 DIAGNOSIS — Z1331 Encounter for screening for depression: Secondary | ICD-10-CM | POA: Diagnosis not present

## 2011-07-23 DIAGNOSIS — K219 Gastro-esophageal reflux disease without esophagitis: Secondary | ICD-10-CM | POA: Diagnosis not present

## 2011-07-23 DIAGNOSIS — E210 Primary hyperparathyroidism: Secondary | ICD-10-CM | POA: Diagnosis not present

## 2011-07-30 DIAGNOSIS — M899 Disorder of bone, unspecified: Secondary | ICD-10-CM | POA: Diagnosis not present

## 2011-07-30 DIAGNOSIS — M949 Disorder of cartilage, unspecified: Secondary | ICD-10-CM | POA: Diagnosis not present

## 2011-07-31 DIAGNOSIS — M25519 Pain in unspecified shoulder: Secondary | ICD-10-CM | POA: Diagnosis not present

## 2011-08-07 DIAGNOSIS — H34239 Retinal artery branch occlusion, unspecified eye: Secondary | ICD-10-CM | POA: Diagnosis not present

## 2011-08-07 DIAGNOSIS — H40019 Open angle with borderline findings, low risk, unspecified eye: Secondary | ICD-10-CM | POA: Diagnosis not present

## 2011-08-07 DIAGNOSIS — H01009 Unspecified blepharitis unspecified eye, unspecified eyelid: Secondary | ICD-10-CM | POA: Diagnosis not present

## 2011-08-07 DIAGNOSIS — H04129 Dry eye syndrome of unspecified lacrimal gland: Secondary | ICD-10-CM | POA: Diagnosis not present

## 2011-08-09 DIAGNOSIS — H04129 Dry eye syndrome of unspecified lacrimal gland: Secondary | ICD-10-CM | POA: Diagnosis not present

## 2011-08-09 DIAGNOSIS — D492 Neoplasm of unspecified behavior of bone, soft tissue, and skin: Secondary | ICD-10-CM | POA: Diagnosis not present

## 2011-08-09 DIAGNOSIS — H40019 Open angle with borderline findings, low risk, unspecified eye: Secondary | ICD-10-CM | POA: Diagnosis not present

## 2011-08-09 DIAGNOSIS — H16149 Punctate keratitis, unspecified eye: Secondary | ICD-10-CM | POA: Diagnosis not present

## 2011-09-03 DIAGNOSIS — L821 Other seborrheic keratosis: Secondary | ICD-10-CM | POA: Diagnosis not present

## 2011-09-03 DIAGNOSIS — L719 Rosacea, unspecified: Secondary | ICD-10-CM | POA: Diagnosis not present

## 2011-09-20 ENCOUNTER — Encounter: Payer: Self-pay | Admitting: Pulmonary Disease

## 2011-09-20 ENCOUNTER — Ambulatory Visit (INDEPENDENT_AMBULATORY_CARE_PROVIDER_SITE_OTHER): Payer: Medicare Other | Admitting: Pulmonary Disease

## 2011-09-20 VITALS — BP 124/82 | HR 67 | Temp 97.8°F | Ht 64.0 in | Wt 169.4 lb

## 2011-09-20 DIAGNOSIS — R05 Cough: Secondary | ICD-10-CM

## 2011-09-20 NOTE — Patient Instructions (Addendum)
Would pay attention to your nasal symptoms, and if nasal drip starts, would start taking claritin or zyrtec once a day.  If this is not getting the job done, would quickly change to chlorpheniramine 8mg  at bedtime. Please call if worsening cough that is not improving with allergy treatment.

## 2011-09-20 NOTE — Progress Notes (Signed)
  Subjective:    Patient ID: [name], female    DOB: 1923/04/17, 78 y.o.   MRN: [id number]  HPI Patient comes in today for followup of her intermittent chronic cough.  This is felt secondary to upper airway irritation from postnasal drip, and possibly from reflux disease.  Each time she is treated with an aggressive antihistamine regimen, her cough resolves.  About 2 weeks ago, she began to have increased rhinorrhea, and noticed worsening cough.  However, by the time she came to this appointment, she feels this has almost totally resolved.  She is back to her usual baseline.   Review of Systems  Constitutional: Negative for fever and unexpected weight change.  HENT: Positive for rhinorrhea. Negative for ear pain, nosebleeds, congestion, sore throat, sneezing, trouble swallowing, dental problem, postnasal drip and sinus pressure.   Eyes: Negative for redness and itching.  Respiratory: Positive for cough, shortness of breath and wheezing. Negative for chest tightness.   Cardiovascular: Negative for palpitations and leg swelling.  Gastrointestinal: Negative for nausea and vomiting.  Genitourinary: Negative for dysuria.  Musculoskeletal: Negative for joint swelling.  Skin: Negative for rash.  Neurological: Negative for headaches.  Hematological: Does not bruise/bleed easily.  Psychiatric/Behavioral: Negative for dysphoric mood. The patient is not nervous/anxious.        Objective:   Physical Exam Overweight female in no acute distress Nose without purulence or discharge noted Chest totally clear Lower extremities have mild edema, no cyanosis Alert and oriented, moves all 4 extremities.       Assessment & Plan:

## 2011-09-20 NOTE — Assessment & Plan Note (Signed)
The patient currently is without a cough, and feels that she is doing well.  I have tried to explain to her that anytime she develops increased rhinorrhea, she is likely to have postnasal drip as well which can trigger her cough.  I have asked her to use an over-the-counter antihistamine more aggressively whenever she starts having nasal symptoms.  If her cough escalates again, and is not responding to this treatment, she is to call.

## 2012-01-08 DIAGNOSIS — E042 Nontoxic multinodular goiter: Secondary | ICD-10-CM | POA: Diagnosis not present

## 2012-01-08 DIAGNOSIS — D126 Benign neoplasm of colon, unspecified: Secondary | ICD-10-CM | POA: Diagnosis not present

## 2012-01-08 DIAGNOSIS — Z23 Encounter for immunization: Secondary | ICD-10-CM | POA: Diagnosis not present

## 2012-01-08 DIAGNOSIS — E559 Vitamin D deficiency, unspecified: Secondary | ICD-10-CM | POA: Diagnosis not present

## 2012-01-08 DIAGNOSIS — E785 Hyperlipidemia, unspecified: Secondary | ICD-10-CM | POA: Diagnosis not present

## 2012-01-19 DIAGNOSIS — I482 Chronic atrial fibrillation, unspecified: Secondary | ICD-10-CM

## 2012-01-19 HISTORY — DX: Chronic atrial fibrillation, unspecified: I48.20

## 2012-02-03 DIAGNOSIS — I10 Essential (primary) hypertension: Secondary | ICD-10-CM | POA: Diagnosis not present

## 2012-02-03 DIAGNOSIS — K219 Gastro-esophageal reflux disease without esophagitis: Secondary | ICD-10-CM | POA: Diagnosis not present

## 2012-02-03 DIAGNOSIS — I4891 Unspecified atrial fibrillation: Secondary | ICD-10-CM | POA: Diagnosis not present

## 2012-02-03 DIAGNOSIS — I499 Cardiac arrhythmia, unspecified: Secondary | ICD-10-CM | POA: Diagnosis not present

## 2012-02-10 DIAGNOSIS — I4891 Unspecified atrial fibrillation: Secondary | ICD-10-CM | POA: Diagnosis not present

## 2012-02-10 DIAGNOSIS — I10 Essential (primary) hypertension: Secondary | ICD-10-CM | POA: Diagnosis not present

## 2012-02-10 DIAGNOSIS — K219 Gastro-esophageal reflux disease without esophagitis: Secondary | ICD-10-CM | POA: Diagnosis not present

## 2012-02-10 DIAGNOSIS — I499 Cardiac arrhythmia, unspecified: Secondary | ICD-10-CM | POA: Diagnosis not present

## 2012-02-17 DIAGNOSIS — I4891 Unspecified atrial fibrillation: Secondary | ICD-10-CM | POA: Diagnosis not present

## 2012-02-17 DIAGNOSIS — R609 Edema, unspecified: Secondary | ICD-10-CM | POA: Diagnosis not present

## 2012-03-10 DIAGNOSIS — H348392 Tributary (branch) retinal vein occlusion, unspecified eye, stable: Secondary | ICD-10-CM | POA: Diagnosis not present

## 2012-03-10 DIAGNOSIS — H353190 Nonexudative age-related macular degeneration, unspecified eye, stage unspecified: Secondary | ICD-10-CM | POA: Diagnosis not present

## 2012-04-27 DIAGNOSIS — Z1231 Encounter for screening mammogram for malignant neoplasm of breast: Secondary | ICD-10-CM | POA: Diagnosis not present

## 2012-04-30 DIAGNOSIS — R920 Mammographic microcalcification found on diagnostic imaging of breast: Secondary | ICD-10-CM | POA: Diagnosis not present

## 2012-05-07 ENCOUNTER — Ambulatory Visit (INDEPENDENT_AMBULATORY_CARE_PROVIDER_SITE_OTHER): Payer: Medicare Other | Admitting: Internal Medicine

## 2012-05-07 ENCOUNTER — Ambulatory Visit (INDEPENDENT_AMBULATORY_CARE_PROVIDER_SITE_OTHER)
Admission: RE | Admit: 2012-05-07 | Discharge: 2012-05-07 | Disposition: A | Discharge status: Home / Self Care | Payer: Medicare Other | Source: Ambulatory Visit | Attending: Internal Medicine | Admitting: Internal Medicine

## 2012-05-07 ENCOUNTER — Encounter: Payer: Self-pay | Admitting: Internal Medicine

## 2012-05-07 VITALS — BP 126/80 | HR 74 | Ht 64.0 in | Wt 171.6 lb

## 2012-05-07 DIAGNOSIS — J209 Acute bronchitis, unspecified: Secondary | ICD-10-CM | POA: Diagnosis not present

## 2012-05-07 DIAGNOSIS — J988 Other specified respiratory disorders: Secondary | ICD-10-CM | POA: Diagnosis not present

## 2012-05-07 MED ORDER — LEVALBUTEROL HCL 0.63 MG/3ML IN NEBU
0.6300 mg | INHALATION_SOLUTION | Freq: Once | RESPIRATORY_TRACT | Status: AC
  Administered 2012-05-08: 0.63 mg via RESPIRATORY_TRACT

## 2012-05-07 MED ORDER — METHYLPREDNISOLONE ACETATE 80 MG/ML IJ SUSP
80.0000 mg | Freq: Once | INTRAMUSCULAR | Status: AC
  Administered 2012-05-08: 80 mg via INTRAMUSCULAR

## 2012-05-07 NOTE — Patient Instructions (Addendum)
Order- CXR  Dx acute  Bronchitis  Neb xop 0.63  Depo 80  Suggest an antihistamine like otc Allegra/ fexofenadine 180, one daily until cough improves  Call to come back to Dr Shelle Iron as needed

## 2012-05-07 NOTE — Progress Notes (Signed)
Subjective:    Patient ID: [name], female    DOB: [birth year], 79 y.o.   MRN: [id number]  HPI Dr Shelle Iron LOV Patient comes in today for followup of her intermittent chronic cough.  This is felt secondary to upper airway irritation from postnasal drip, and possibly from reflux disease.  Each time she is treated with an aggressive antihistamine regimen, her cough resolves.  About 2 weeks ago, she began to have increased rhinorrhea, and noticed worsening cough.  However, by the time she came to this appointment, she feels this has almost totally resolved.  She is back to her usual baseline.  05/07/12- Acute visit- CDY-88 yoF never smoker followed for Dr Shelle Iron for chronic cough. ACUTE VISIT: [facility] patient; took med on 5 days ago and got stuck in throat-started coughing and lasted about 3 hours; since has had a terrible croupy cough-dry non productive; wind is making it worse.  wheezing as well. She definitely coughed the pill out, but cough persists. No nasal drainage, fever or sore throat. Has not tried any treatment on her own although Dr. Shelle Iron had recommended she start Zyrtec promptly when cough is active.  ROS-see HPI Constitutional:   No-   weight loss, night sweats, fevers, chills, fatigue, lassitude. HEENT:   No-  headaches, difficulty swallowing, tooth/dental problems, sore throat,       No-  sneezing, itching, ear ache, nasal congestion, post nasal drip,  CV:  No-   chest pain, orthopnea, PND, swelling in lower extremities, anasarca,                                  dizziness, palpitations Resp: No-   shortness of breath with exertion or at rest.              No-   productive cough,  + non-productive cough,  No- coughing up of blood.              No-   change in color of mucus.  No- wheezing.   Skin: No-   rash or lesions. GI:  No-   heartburn, indigestion, abdominal pain, nausea, vomiting,  GU:  MS:  No-   joint pain or swelling.  . Neuro-     nothing unusual Psych:  No- change in  mood or affect. No depression or anxiety.  No memory loss.    Objective:    OBJ- Physical Exam General- Alert, Oriented, Affect-appropriate, Distress- none acute Skin- rash-none, lesions- none, excoriation- none Lymphadenopathy- none Head- atraumatic            Eyes- Gross vision intact, PERRLA, conjunctivae and secretions clear            Ears- Hearing, canals-normal            Nose- Clear, no-Septal dev, mucus, polyps, erosion, perforation             Throat- Mallampati II , mucosa clear , drainage- none, tonsils- atrophic. Voice is normal. Neck- flexible , trachea midline, no stridor , thyroid nl, carotid no bruit Chest - symmetrical excursion , unlabored           Heart/CV- RRR , no murmur , no gallop  , no rub, nl s1 s2                           - JVD- none , edema- none, stasis  changes- none, varices- none           Lung- clear to P&A, wheeze- none, +cough-sounds upper airway, dullness-none, rub- none           Chest wall-  Abd-  Br/ Gen/ Rectal- Not done, not indicated Extrem- cyanosis- none, clubbing, none, atrophy- none, strength- nl Neuro- grossly intact to observation  Assessment & Plan:

## 2012-05-08 DIAGNOSIS — J209 Acute bronchitis, unspecified: Secondary | ICD-10-CM | POA: Diagnosis not present

## 2012-05-08 MED ORDER — AZELASTINE-FLUTICASONE 137-50 MCG/ACT NA SUSP: 2.0000 | Freq: Every day | NASAL | Status: DC

## 2012-05-10 NOTE — Assessment & Plan Note (Signed)
She is confident that she coughed the pill out, but she has irritated her upper airway. Plan-chest x-ray, nebulizer treatment, Depo-Medrol. Followup with Dr.Clance

## 2012-05-15 DIAGNOSIS — I4891 Unspecified atrial fibrillation: Secondary | ICD-10-CM | POA: Diagnosis not present

## 2012-07-07 DIAGNOSIS — I10 Essential (primary) hypertension: Secondary | ICD-10-CM | POA: Diagnosis not present

## 2012-07-07 DIAGNOSIS — E785 Hyperlipidemia, unspecified: Secondary | ICD-10-CM | POA: Diagnosis not present

## 2012-07-07 DIAGNOSIS — E042 Nontoxic multinodular goiter: Secondary | ICD-10-CM | POA: Diagnosis not present

## 2012-07-07 DIAGNOSIS — Z6829 Body mass index (BMI) 29.0-29.9, adult: Secondary | ICD-10-CM | POA: Diagnosis not present

## 2012-07-07 DIAGNOSIS — I4891 Unspecified atrial fibrillation: Secondary | ICD-10-CM | POA: Diagnosis not present

## 2012-07-07 DIAGNOSIS — E559 Vitamin D deficiency, unspecified: Secondary | ICD-10-CM | POA: Diagnosis not present

## 2012-07-07 DIAGNOSIS — D126 Benign neoplasm of colon, unspecified: Secondary | ICD-10-CM | POA: Diagnosis not present

## 2012-07-20 DIAGNOSIS — L821 Other seborrheic keratosis: Secondary | ICD-10-CM | POA: Diagnosis not present

## 2012-07-20 DIAGNOSIS — L820 Inflamed seborrheic keratosis: Secondary | ICD-10-CM | POA: Diagnosis not present

## 2012-07-24 DIAGNOSIS — K219 Gastro-esophageal reflux disease without esophagitis: Secondary | ICD-10-CM | POA: Diagnosis not present

## 2012-07-24 DIAGNOSIS — Z1331 Encounter for screening for depression: Secondary | ICD-10-CM | POA: Diagnosis not present

## 2012-07-24 DIAGNOSIS — I4891 Unspecified atrial fibrillation: Secondary | ICD-10-CM | POA: Diagnosis not present

## 2012-07-24 DIAGNOSIS — M545 Low back pain: Secondary | ICD-10-CM | POA: Diagnosis not present

## 2012-07-24 DIAGNOSIS — I10 Essential (primary) hypertension: Secondary | ICD-10-CM | POA: Diagnosis not present

## 2012-07-24 DIAGNOSIS — Z0000 Encounter for general adult medical examination without abnormal findings: Secondary | ICD-10-CM | POA: Diagnosis not present

## 2012-08-05 DIAGNOSIS — M79609 Pain in unspecified limb: Secondary | ICD-10-CM | POA: Diagnosis not present

## 2012-08-05 DIAGNOSIS — T148XXA Other injury of unspecified body region, initial encounter: Secondary | ICD-10-CM | POA: Diagnosis not present

## 2012-08-06 DIAGNOSIS — M79609 Pain in unspecified limb: Secondary | ICD-10-CM | POA: Diagnosis not present

## 2012-08-10 DIAGNOSIS — Z961 Presence of intraocular lens: Secondary | ICD-10-CM | POA: Diagnosis not present

## 2012-08-10 DIAGNOSIS — H35039 Hypertensive retinopathy, unspecified eye: Secondary | ICD-10-CM | POA: Diagnosis not present

## 2012-08-10 DIAGNOSIS — I7090 Unspecified atherosclerosis: Secondary | ICD-10-CM | POA: Diagnosis not present

## 2012-08-10 DIAGNOSIS — H04129 Dry eye syndrome of unspecified lacrimal gland: Secondary | ICD-10-CM | POA: Diagnosis not present

## 2012-08-10 DIAGNOSIS — H353190 Nonexudative age-related macular degeneration, unspecified eye, stage unspecified: Secondary | ICD-10-CM | POA: Diagnosis not present

## 2012-08-10 DIAGNOSIS — H02409 Unspecified ptosis of unspecified eyelid: Secondary | ICD-10-CM | POA: Diagnosis not present

## 2012-08-10 DIAGNOSIS — H40019 Open angle with borderline findings, low risk, unspecified eye: Secondary | ICD-10-CM | POA: Diagnosis not present

## 2012-08-11 DIAGNOSIS — G4700 Insomnia, unspecified: Secondary | ICD-10-CM | POA: Diagnosis not present

## 2012-08-11 DIAGNOSIS — I10 Essential (primary) hypertension: Secondary | ICD-10-CM | POA: Diagnosis not present

## 2012-09-01 DIAGNOSIS — I10 Essential (primary) hypertension: Secondary | ICD-10-CM | POA: Diagnosis not present

## 2012-09-07 DIAGNOSIS — H348392 Tributary (branch) retinal vein occlusion, unspecified eye, stable: Secondary | ICD-10-CM | POA: Diagnosis not present

## 2012-09-07 DIAGNOSIS — H43819 Vitreous degeneration, unspecified eye: Secondary | ICD-10-CM | POA: Diagnosis not present

## 2012-09-07 DIAGNOSIS — H35369 Drusen (degenerative) of macula, unspecified eye: Secondary | ICD-10-CM | POA: Diagnosis not present

## 2012-09-07 DIAGNOSIS — H353190 Nonexudative age-related macular degeneration, unspecified eye, stage unspecified: Secondary | ICD-10-CM | POA: Diagnosis not present

## 2012-09-09 DIAGNOSIS — H40019 Open angle with borderline findings, low risk, unspecified eye: Secondary | ICD-10-CM | POA: Diagnosis not present

## 2012-09-09 DIAGNOSIS — H16149 Punctate keratitis, unspecified eye: Secondary | ICD-10-CM | POA: Diagnosis not present

## 2012-09-09 DIAGNOSIS — H34239 Retinal artery branch occlusion, unspecified eye: Secondary | ICD-10-CM | POA: Diagnosis not present

## 2012-09-09 DIAGNOSIS — L719 Rosacea, unspecified: Secondary | ICD-10-CM | POA: Diagnosis not present

## 2012-09-30 DIAGNOSIS — I10 Essential (primary) hypertension: Secondary | ICD-10-CM | POA: Diagnosis not present

## 2012-10-22 DIAGNOSIS — R928 Other abnormal and inconclusive findings on diagnostic imaging of breast: Secondary | ICD-10-CM | POA: Diagnosis not present

## 2012-10-22 DIAGNOSIS — Z09 Encounter for follow-up examination after completed treatment for conditions other than malignant neoplasm: Secondary | ICD-10-CM | POA: Diagnosis not present

## 2012-10-28 DIAGNOSIS — N644 Mastodynia: Secondary | ICD-10-CM | POA: Diagnosis not present

## 2012-10-31 DIAGNOSIS — M25519 Pain in unspecified shoulder: Secondary | ICD-10-CM | POA: Diagnosis not present

## 2012-10-31 DIAGNOSIS — M12519 Traumatic arthropathy, unspecified shoulder: Secondary | ICD-10-CM | POA: Diagnosis not present

## 2012-11-05 DIAGNOSIS — N644 Mastodynia: Secondary | ICD-10-CM | POA: Diagnosis not present

## 2012-11-05 DIAGNOSIS — Z803 Family history of malignant neoplasm of breast: Secondary | ICD-10-CM | POA: Diagnosis not present

## 2012-11-24 DIAGNOSIS — Z96649 Presence of unspecified artificial hip joint: Secondary | ICD-10-CM | POA: Diagnosis not present

## 2012-12-02 DIAGNOSIS — Z96649 Presence of unspecified artificial hip joint: Secondary | ICD-10-CM | POA: Diagnosis not present

## 2012-12-10 DIAGNOSIS — M25519 Pain in unspecified shoulder: Secondary | ICD-10-CM | POA: Diagnosis not present

## 2012-12-16 DIAGNOSIS — M25519 Pain in unspecified shoulder: Secondary | ICD-10-CM | POA: Diagnosis not present

## 2012-12-22 DIAGNOSIS — Z23 Encounter for immunization: Secondary | ICD-10-CM | POA: Diagnosis not present

## 2012-12-22 DIAGNOSIS — I10 Essential (primary) hypertension: Secondary | ICD-10-CM | POA: Diagnosis not present

## 2012-12-22 DIAGNOSIS — I4891 Unspecified atrial fibrillation: Secondary | ICD-10-CM | POA: Diagnosis not present

## 2012-12-30 ENCOUNTER — Encounter (HOSPITAL_COMMUNITY): Payer: Self-pay | Admitting: Pharmacy Technician

## 2013-01-01 ENCOUNTER — Other Ambulatory Visit (HOSPITAL_COMMUNITY): Payer: Self-pay | Admitting: *Deleted

## 2013-01-01 ENCOUNTER — Encounter (HOSPITAL_COMMUNITY)
Admission: RE | Admit: 2013-01-01 | Discharge: 2013-01-01 | Disposition: A | Discharge status: Home / Self Care | Payer: Medicare Other | Source: Ambulatory Visit | Attending: Orthopedic Surgery | Admitting: Orthopedic Surgery

## 2013-01-01 ENCOUNTER — Encounter (HOSPITAL_COMMUNITY): Payer: Self-pay

## 2013-01-01 DIAGNOSIS — Z01812 Encounter for preprocedural laboratory examination: Secondary | ICD-10-CM | POA: Insufficient documentation

## 2013-01-01 DIAGNOSIS — Z01810 Encounter for preprocedural cardiovascular examination: Secondary | ICD-10-CM | POA: Diagnosis not present

## 2013-01-01 DIAGNOSIS — Z01818 Encounter for other preprocedural examination: Secondary | ICD-10-CM | POA: Diagnosis not present

## 2013-01-01 HISTORY — DX: Cough: R05

## 2013-01-01 HISTORY — DX: Unspecified osteoarthritis, unspecified site: M19.90

## 2013-01-01 HISTORY — DX: Other specified cough: R05.8

## 2013-01-01 HISTORY — DX: Personal history of other venous thrombosis and embolism: Z86.718

## 2013-01-01 HISTORY — DX: Cardiac murmur, unspecified: R01.1

## 2013-01-01 HISTORY — DX: Peripheral vascular disease, unspecified: I73.9

## 2013-01-01 LAB — TYPE AND SCREEN
ABO/RH(D): A POS
Antibody Screen: NEGATIVE

## 2013-01-01 LAB — ABO/RH: ABO/RH(D): A POS

## 2013-01-01 LAB — CBC
HCT: 39 % (ref 36.0–46.0)
MCV: 95.4 fL (ref 78.0–100.0)
Platelets: 257 10*3/uL (ref 150–400)
RBC: 4.09 MIL/uL (ref 3.87–5.11)
WBC: 7.4 10*3/uL (ref 4.0–10.5)

## 2013-01-01 LAB — BASIC METABOLIC PANEL
CO2: 29 mEq/L (ref 19–32)
Chloride: 100 mEq/L (ref 96–112)
Creatinine, Ser: 1.18 mg/dL — ABNORMAL HIGH (ref 0.50–1.10)

## 2013-01-01 NOTE — Pre-Procedure Instructions (Addendum)
[name]  01/01/2013   Your procedure is scheduled on:  01/07/13  Report to [facility] cone short stay admitting at 530 AM.  Call this number if you have problems the morning of surgery: [phone]   Remember:   Do not eat food or drink liquids after midnight.   Take these medicines the morning of surgery with A SIP OF WATER: levothyroxine, metoprolol, omeprazole, macular supp             STOP xarelto   01/03/13 per dr         Despina Arias all herbel meds, nsaids (aleve,naproxen,advil,ibuprofen) 5 days prior to surgery          Do not wear jewelry, make-up or nail polish.  Do not wear lotions, powders, or perfumes. You may wear deodorant.  Do not shave 48 hours prior to surgery. Men may shave face and neck.  Do not bring valuables to the hospital.  [facility] is not responsible                  for any belongings or valuables.               Contacts, dentures or bridgework may not be worn into surgery.  Leave suitcase in the car. After surgery it may be brought to your room.  For patients admitted to the hospital, discharge time is determined by your                treatment team.               Patients discharged the day of surgery will not be allowed to drive  home.  Name and phone number of your driver:   Special Instructions: Shower using CHG 2 nights before surgery and the night before surgery.  If you shower the day of surgery use CHG.  Use special wash - you have one bottle of CHG for all showers.  You should use approximately 1/3 of the bottle for each shower.   Please read over the following fact sheets that you were given: Pain Booklet, Coughing and Deep Breathing, Blood Transfusion Information and Surgical Site Infection Prevention

## 2013-01-04 DIAGNOSIS — M1990 Unspecified osteoarthritis, unspecified site: Secondary | ICD-10-CM | POA: Diagnosis not present

## 2013-01-04 DIAGNOSIS — I10 Essential (primary) hypertension: Secondary | ICD-10-CM | POA: Diagnosis not present

## 2013-01-04 DIAGNOSIS — M810 Age-related osteoporosis without current pathological fracture: Secondary | ICD-10-CM | POA: Diagnosis not present

## 2013-01-04 DIAGNOSIS — Z1331 Encounter for screening for depression: Secondary | ICD-10-CM | POA: Diagnosis not present

## 2013-01-04 DIAGNOSIS — E042 Nontoxic multinodular goiter: Secondary | ICD-10-CM | POA: Diagnosis not present

## 2013-01-04 DIAGNOSIS — E559 Vitamin D deficiency, unspecified: Secondary | ICD-10-CM | POA: Diagnosis not present

## 2013-01-04 DIAGNOSIS — G2581 Restless legs syndrome: Secondary | ICD-10-CM | POA: Diagnosis not present

## 2013-01-04 DIAGNOSIS — D126 Benign neoplasm of colon, unspecified: Secondary | ICD-10-CM | POA: Diagnosis not present

## 2013-01-04 NOTE — Progress Notes (Signed)
Anesthesia Chart Review:  Patient is a 79 year old female scheduled for right reverse total shoulder on 01/07/13 by Dr. Rennis Chris.  History includes non-smoker, chronic afib on Xarelto, murmur (mild MR/AR and mod to severe TR by 02/10/12 echo), hypothyroidism, PVD ("blood clot" behind right eye '60's), GERD, arthritis, scoliosis, bilateral THA.  PCP is Dr. Kirby Funk who cleared patient for this procedure.  She is not followed by a cardiologist. Pulmonologist is Dr. Shelle Iron.    EKG on 01/01/13 showed afib @ 71 bpm, low voltage QRS, cannot rule out anteroseptal infarct (age undetermined). EKG appears stable when compared to prior EKG on 02/03/12 at Dr. Jone Baseman office.  Echo on 02/10/12 showed normal Lv size and function, LVEF 60-65%, dilated atria, mild MR/AR/PR, moderate to severe TR, moderately elevated estimated RVSP 50 mmHg.  CXR on 05/07/12 showed: Mild hyperinflation and central bronchial thickening possibly due to reactive airways disease. No evidence of active pulmonary disease.  PFTs on 08/17/10 showed FVC 2.27 (92%), FEV1 1.75 (95%).  Preoperative labs noted.    Since she is not followed by cardiology, I had previously reviewed patient's history with anesthesiologist Dr. Michelle Piper once I received her clearance note from Dr. Dub Mikes office.  Patient is rate controlled, had echo last year, and was cleared by her medical doctor, so if no acute changes then it is anticipated that she can proceed as planned. She reports her Xarelto was stopped yesterday (12/24/2012) in anticipation of surgery.  Velna Ochs [facility] Short Stay Center/Anesthesiology Phone [phone] 01/04/2013 10:14 AM

## 2013-01-06 MED ORDER — CEFAZOLIN SODIUM-DEXTROSE 2-3 GM-% IV SOLR
2.0000 g | INTRAVENOUS | Status: AC
  Administered 2013-01-07: 2 g via INTRAVENOUS
  Filled 2013-01-06: qty 50

## 2013-01-07 ENCOUNTER — Inpatient Hospital Stay: Admit: 2013-01-07 | Payer: Self-pay | Admitting: Orthopedic Surgery

## 2013-01-07 ENCOUNTER — Inpatient Hospital Stay (HOSPITAL_COMMUNITY): Payer: Medicare Other | Admitting: Anesthesiology

## 2013-01-07 ENCOUNTER — Encounter (HOSPITAL_COMMUNITY): Payer: Medicare Other | Admitting: Vascular Surgery

## 2013-01-07 ENCOUNTER — Inpatient Hospital Stay (HOSPITAL_COMMUNITY)
Admission: RE | Admit: 2013-01-07 | Discharge: 2013-01-09 | DRG: 483 | Disposition: A | Discharge status: Home Health | Payer: Medicare Other | Source: Ambulatory Visit | Attending: Orthopedic Surgery | Admitting: Orthopedic Surgery

## 2013-01-07 ENCOUNTER — Encounter (HOSPITAL_COMMUNITY)
Admission: RE | Disposition: A | Discharge status: Home Health | Payer: Self-pay | Source: Ambulatory Visit | Attending: Orthopedic Surgery

## 2013-01-07 ENCOUNTER — Encounter (HOSPITAL_COMMUNITY): Payer: Self-pay

## 2013-01-07 DIAGNOSIS — M4120 Other idiopathic scoliosis, site unspecified: Secondary | ICD-10-CM | POA: Diagnosis present

## 2013-01-07 DIAGNOSIS — I10 Essential (primary) hypertension: Secondary | ICD-10-CM | POA: Diagnosis present

## 2013-01-07 DIAGNOSIS — I739 Peripheral vascular disease, unspecified: Secondary | ICD-10-CM | POA: Diagnosis present

## 2013-01-07 DIAGNOSIS — Z8261 Family history of arthritis: Secondary | ICD-10-CM | POA: Diagnosis not present

## 2013-01-07 DIAGNOSIS — G8918 Other acute postprocedural pain: Secondary | ICD-10-CM | POA: Diagnosis not present

## 2013-01-07 DIAGNOSIS — R112 Nausea with vomiting, unspecified: Secondary | ICD-10-CM | POA: Diagnosis not present

## 2013-01-07 DIAGNOSIS — M19019 Primary osteoarthritis, unspecified shoulder: Principal | ICD-10-CM | POA: Diagnosis present

## 2013-01-07 DIAGNOSIS — Z880 Allergy status to penicillin: Secondary | ICD-10-CM | POA: Diagnosis not present

## 2013-01-07 DIAGNOSIS — M25519 Pain in unspecified shoulder: Secondary | ICD-10-CM | POA: Diagnosis not present

## 2013-01-07 DIAGNOSIS — Z801 Family history of malignant neoplasm of trachea, bronchus and lung: Secondary | ICD-10-CM | POA: Diagnosis not present

## 2013-01-07 DIAGNOSIS — K219 Gastro-esophageal reflux disease without esophagitis: Secondary | ICD-10-CM | POA: Diagnosis present

## 2013-01-07 DIAGNOSIS — Z803 Family history of malignant neoplasm of breast: Secondary | ICD-10-CM | POA: Diagnosis not present

## 2013-01-07 DIAGNOSIS — Z882 Allergy status to sulfonamides status: Secondary | ICD-10-CM | POA: Diagnosis not present

## 2013-01-07 DIAGNOSIS — Z8249 Family history of ischemic heart disease and other diseases of the circulatory system: Secondary | ICD-10-CM

## 2013-01-07 DIAGNOSIS — E039 Hypothyroidism, unspecified: Secondary | ICD-10-CM | POA: Diagnosis present

## 2013-01-07 DIAGNOSIS — Z823 Family history of stroke: Secondary | ICD-10-CM

## 2013-01-07 DIAGNOSIS — M810 Age-related osteoporosis without current pathological fracture: Secondary | ICD-10-CM | POA: Diagnosis present

## 2013-01-07 DIAGNOSIS — Z96649 Presence of unspecified artificial hip joint: Secondary | ICD-10-CM | POA: Diagnosis not present

## 2013-01-07 DIAGNOSIS — S43429A Sprain of unspecified rotator cuff capsule, initial encounter: Secondary | ICD-10-CM | POA: Diagnosis not present

## 2013-01-07 HISTORY — PX: REVERSE SHOULDER ARTHROPLASTY: SHX5054

## 2013-01-07 SURGERY — ARTHROPLASTY, SHOULDER, TOTAL, REVERSE
Anesthesia: General | Site: Shoulder | Laterality: Right

## 2013-01-07 SURGERY — ARTHROPLASTY, SHOULDER, TOTAL, REVERSE
Anesthesia: General | Site: Shoulder | Laterality: Right | Wound class: Clean

## 2013-01-07 MED ORDER — METOPROLOL SUCCINATE ER 50 MG PO TB24
50.0000 mg | ORAL_TABLET | Freq: Two times a day (BID) | ORAL | Status: DC
  Administered 2013-01-07 – 2013-01-08 (×3): 50 mg via ORAL
  Filled 2013-01-07 (×5): qty 1

## 2013-01-07 MED ORDER — TEMAZEPAM 15 MG PO CAPS: 15.0000 mg | ORAL_CAPSULE | Freq: Every evening | ORAL | Status: DC | PRN

## 2013-01-07 MED ORDER — CEFAZOLIN SODIUM 1-5 GM-% IV SOLN
1.0000 g | Freq: Four times a day (QID) | INTRAVENOUS | Status: AC
  Administered 2013-01-07 – 2013-01-08 (×3): 1 g via INTRAVENOUS
  Filled 2013-01-07 (×3): qty 50

## 2013-01-07 MED ORDER — ACETAMINOPHEN 650 MG RE SUPP: 650.0000 mg | Freq: Four times a day (QID) | RECTAL | Status: DC | PRN

## 2013-01-07 MED ORDER — METHOCARBAMOL 100 MG/ML IJ SOLN
500.0000 mg | Freq: Four times a day (QID) | INTRAVENOUS | Status: DC | PRN
  Filled 2013-01-07: qty 5

## 2013-01-07 MED ORDER — METOCLOPRAMIDE HCL 5 MG/ML IJ SOLN: 5.0000 mg | Freq: Three times a day (TID) | INTRAMUSCULAR | Status: DC | PRN

## 2013-01-07 MED ORDER — ONDANSETRON HCL 4 MG/2ML IJ SOLN
4.0000 mg | Freq: Four times a day (QID) | INTRAMUSCULAR | Status: DC | PRN
  Administered 2013-01-08: 4 mg via INTRAVENOUS
  Filled 2013-01-07: qty 2

## 2013-01-07 MED ORDER — PANTOPRAZOLE SODIUM 40 MG PO TBEC
40.0000 mg | DELAYED_RELEASE_TABLET | Freq: Every day | ORAL | Status: DC
  Administered 2013-01-07 – 2013-01-08 (×2): 40 mg via ORAL
  Filled 2013-01-07 (×2): qty 1

## 2013-01-07 MED ORDER — SODIUM CHLORIDE 0.9 % IR SOLN
Status: DC | PRN
  Administered 2013-01-07: 1000 mL

## 2013-01-07 MED ORDER — METHOCARBAMOL 500 MG PO TABS
500.0000 mg | ORAL_TABLET | Freq: Four times a day (QID) | ORAL | Status: DC | PRN
  Administered 2013-01-07 – 2013-01-08 (×2): 500 mg via ORAL
  Filled 2013-01-07 (×2): qty 1

## 2013-01-07 MED ORDER — MENTHOL 3 MG MT LOZG
1.0000 | LOZENGE | OROMUCOSAL | Status: DC | PRN
  Filled 2013-01-07: qty 9

## 2013-01-07 MED ORDER — HYDROCHLOROTHIAZIDE 25 MG PO TABS
25.0000 mg | ORAL_TABLET | Freq: Every day | ORAL | Status: DC
  Administered 2013-01-07 – 2013-01-08 (×2): 25 mg via ORAL
  Filled 2013-01-07 (×3): qty 1

## 2013-01-07 MED ORDER — LACTATED RINGERS IV SOLN
INTRAVENOUS | Status: DC
  Administered 2013-01-07: 21:00:00 via INTRAVENOUS

## 2013-01-07 MED ORDER — CHLORHEXIDINE GLUCONATE 4 % EX LIQD: 60.0000 mL | Freq: Once | CUTANEOUS | Status: DC

## 2013-01-07 MED ORDER — GLYCOPYRROLATE 0.2 MG/ML IJ SOLN
INTRAMUSCULAR | Status: DC | PRN
  Administered 2013-01-07: 0.2 mg via INTRAVENOUS
  Administered 2013-01-07: 0.4 mg via INTRAVENOUS

## 2013-01-07 MED ORDER — NEOSTIGMINE METHYLSULFATE 1 MG/ML IJ SOLN
INTRAMUSCULAR | Status: DC | PRN
  Administered 2013-01-07: 3 mg via INTRAVENOUS

## 2013-01-07 MED ORDER — LIDOCAINE HCL (CARDIAC) 20 MG/ML IV SOLN
INTRAVENOUS | Status: DC | PRN
  Administered 2013-01-07: 20 mg via INTRAVENOUS

## 2013-01-07 MED ORDER — ONDANSETRON HCL 4 MG PO TABS
4.0000 mg | ORAL_TABLET | Freq: Four times a day (QID) | ORAL | Status: DC | PRN
  Administered 2013-01-07: 4 mg via ORAL
  Filled 2013-01-07: qty 1

## 2013-01-07 MED ORDER — ACETAMINOPHEN 325 MG PO TABS: 650.0000 mg | ORAL_TABLET | Freq: Four times a day (QID) | ORAL | Status: DC | PRN

## 2013-01-07 MED ORDER — FENTANYL CITRATE 0.05 MG/ML IJ SOLN
INTRAMUSCULAR | Status: DC | PRN
  Administered 2013-01-07: 50 ug via INTRAVENOUS

## 2013-01-07 MED ORDER — PHENOL 1.4 % MT LIQD: 1.0000 | OROMUCOSAL | Status: DC | PRN

## 2013-01-07 MED ORDER — HYDROMORPHONE HCL PF 1 MG/ML IJ SOLN
0.2500 mg | INTRAMUSCULAR | Status: DC | PRN
Start: 2013-01-07 — End: 2013-01-09

## 2013-01-07 MED ORDER — LACTATED RINGERS IV SOLN
INTRAVENOUS | Status: DC
  Administered 2013-01-07 (×2): via INTRAVENOUS

## 2013-01-07 MED ORDER — ALUM & MAG HYDROXIDE-SIMETH 200-200-20 MG/5ML PO SUSP: 30.0000 mL | ORAL | Status: DC | PRN

## 2013-01-07 MED ORDER — METOCLOPRAMIDE HCL 10 MG PO TABS
5.0000 mg | ORAL_TABLET | Freq: Three times a day (TID) | ORAL | Status: DC | PRN
  Administered 2013-01-08: 10 mg via ORAL
  Filled 2013-01-07: qty 1

## 2013-01-07 MED ORDER — STERILE WATER FOR IRRIGATION IR SOLN
Status: DC | PRN
  Administered 2013-01-07: 1000 mL

## 2013-01-07 MED ORDER — DIPHENHYDRAMINE HCL 12.5 MG/5ML PO ELIX: 12.5000 mg | ORAL_SOLUTION | ORAL | Status: DC | PRN

## 2013-01-07 MED ORDER — HYDROMORPHONE HCL PF 1 MG/ML IJ SOLN: 0.2500 mg | INTRAMUSCULAR | Status: DC | PRN

## 2013-01-07 MED ORDER — SODIUM CHLORIDE 0.9 % IV SOLN
10.0000 mg | INTRAVENOUS | Status: DC | PRN
  Administered 2013-01-07: 15 ug/min via INTRAVENOUS

## 2013-01-07 MED ORDER — PHENYLEPHRINE HCL 10 MG/ML IJ SOLN
INTRAMUSCULAR | Status: DC | PRN
  Administered 2013-01-07: 120 ug via INTRAVENOUS
  Administered 2013-01-07: 80 ug via INTRAVENOUS

## 2013-01-07 MED ORDER — ROCURONIUM BROMIDE 100 MG/10ML IV SOLN
INTRAVENOUS | Status: DC | PRN
  Administered 2013-01-07: 40 mg via INTRAVENOUS

## 2013-01-07 MED ORDER — OXYCODONE-ACETAMINOPHEN 5-325 MG PO TABS
1.0000 | ORAL_TABLET | ORAL | Status: DC | PRN
  Administered 2013-01-07 – 2013-01-08 (×5): 1 via ORAL
  Filled 2013-01-07: qty 2
  Filled 2013-01-07: qty 1
  Filled 2013-01-07: qty 2
  Filled 2013-01-07 (×2): qty 1

## 2013-01-07 MED ORDER — ONDANSETRON HCL 4 MG/2ML IJ SOLN: 4.0000 mg | Freq: Once | INTRAMUSCULAR | Status: DC | PRN

## 2013-01-07 MED ORDER — ONDANSETRON HCL 4 MG/2ML IJ SOLN
INTRAMUSCULAR | Status: DC | PRN
  Administered 2013-01-07: 4 mg via INTRAVENOUS

## 2013-01-07 MED ORDER — LIDOCAINE HCL 4 % MT SOLN
OROMUCOSAL | Status: DC | PRN
  Administered 2013-01-07: 4 mL via TOPICAL

## 2013-01-07 MED ORDER — RIVAROXABAN 15 MG PO TABS
15.0000 mg | ORAL_TABLET | Freq: Every day | ORAL | Status: DC
  Administered 2013-01-07 – 2013-01-08 (×2): 15 mg via ORAL
  Filled 2013-01-07 (×3): qty 1

## 2013-01-07 MED ORDER — DOCUSATE SODIUM 100 MG PO CAPS
100.0000 mg | ORAL_CAPSULE | Freq: Two times a day (BID) | ORAL | Status: DC
  Administered 2013-01-07 – 2013-01-08 (×4): 100 mg via ORAL
  Filled 2013-01-07 (×6): qty 1

## 2013-01-07 MED ORDER — PROPOFOL 10 MG/ML IV BOLUS
INTRAVENOUS | Status: DC | PRN
  Administered 2013-01-07: 90 mg via INTRAVENOUS

## 2013-01-07 MED ORDER — LEVOTHYROXINE SODIUM 100 MCG PO TABS
100.0000 ug | ORAL_TABLET | Freq: Every day | ORAL | Status: DC
  Administered 2013-01-08: 100 ug via ORAL
  Filled 2013-01-07 (×4): qty 1

## 2013-01-07 SURGICAL SUPPLY — 71 items
BIT DRILL 170X2.5X (BIT) IMPLANT
BIT DRL 170X2.5X (BIT) ×1
BLADE SAW SGTL 83.5X18.5 (BLADE) ×2 IMPLANT
BRUSH FEMORAL CANAL (MISCELLANEOUS) IMPLANT
CAPT SHOULD DELTAXTEND CEM MOD ×2 IMPLANT
CEMENT BONE DEPUY (Cement) ×3 IMPLANT
CLOTH BEACON ORANGE TIMEOUT ST (SAFETY) ×2 IMPLANT
CLSR STERI-STRIP ANTIMIC 1/2X4 (GAUZE/BANDAGES/DRESSINGS) ×1 IMPLANT
COVER SURGICAL LIGHT HANDLE (MISCELLANEOUS) ×2 IMPLANT
DRAPE INCISE IOBAN 66X45 STRL (DRAPES) ×2 IMPLANT
DRAPE SURG 17X11 SM STRL (DRAPES) ×2 IMPLANT
DRAPE U-SHAPE 47X51 STRL (DRAPES) ×2 IMPLANT
DRILL 2.5 (BIT) ×2
DRILL BIT 7/64X5 (BIT) ×2 IMPLANT
DRSG AQUACEL AG ADV 3.5X10 (GAUZE/BANDAGES/DRESSINGS) ×2 IMPLANT
DRSG MEPILEX BORDER 4X8 (GAUZE/BANDAGES/DRESSINGS) ×2 IMPLANT
DURAPREP 26ML APPLICATOR (WOUND CARE) ×4 IMPLANT
ELECT BLADE 4.0 EZ CLEAN MEGAD (MISCELLANEOUS) ×2
ELECT CAUTERY BLADE 6.4 (BLADE) ×2 IMPLANT
ELECT REM PT RETURN 9FT ADLT (ELECTROSURGICAL) ×2
ELECTRODE BLDE 4.0 EZ CLN MEGD (MISCELLANEOUS) ×1 IMPLANT
ELECTRODE REM PT RTRN 9FT ADLT (ELECTROSURGICAL) ×1 IMPLANT
FACESHIELD LNG OPTICON STERILE (SAFETY) ×6 IMPLANT
GLOVE BIO SURGEON STRL SZ7.5 (GLOVE) ×2 IMPLANT
GLOVE BIO SURGEON STRL SZ8 (GLOVE) ×2 IMPLANT
GLOVE EUDERMIC 7 POWDERFREE (GLOVE) ×2 IMPLANT
GLOVE SS BIOGEL STRL SZ 7.5 (GLOVE) ×1 IMPLANT
GLOVE SUPERSENSE BIOGEL SZ 7.5 (GLOVE) ×1
GOWN STRL NON-REIN LRG LVL3 (GOWN DISPOSABLE) ×2 IMPLANT
GOWN STRL REIN XL XLG (GOWN DISPOSABLE) ×4 IMPLANT
HANDPIECE INTERPULSE COAX TIP (DISPOSABLE)
KIT BASIN OR (CUSTOM PROCEDURE TRAY) ×2 IMPLANT
KIT ROOM TURNOVER OR (KITS) ×2 IMPLANT
MANIFOLD NEPTUNE II (INSTRUMENTS) ×2 IMPLANT
NDL HYPO 25GX1X1/2 BEV (NEEDLE) IMPLANT
NDL SUT 6 .5 CRC .975X.05 MAYO (NEEDLE) IMPLANT
NEEDLE HYPO 25GX1X1/2 BEV (NEEDLE) IMPLANT
NEEDLE MAYO TAPER (NEEDLE) ×4
NS IRRIG 1000ML POUR BTL (IV SOLUTION) ×2 IMPLANT
PACK SHOULDER (CUSTOM PROCEDURE TRAY) ×2 IMPLANT
PAD ARMBOARD 7.5X6 YLW CONV (MISCELLANEOUS) ×4 IMPLANT
PASSER SUT SWANSON 36MM LOOP (INSTRUMENTS) IMPLANT
PIN GUIDE 1.2 (PIN) ×1 IMPLANT
PIN GUIDE GLENOPHERE 1.5MX300M (PIN) ×1 IMPLANT
PIN METAGLENE 2.5 (PIN) ×2 IMPLANT
PRESSURIZER FEMORAL UNIV (MISCELLANEOUS) IMPLANT
SET HNDPC FAN SPRY TIP SCT (DISPOSABLE) IMPLANT
SLING ARM FOAM STRAP LRG (SOFTGOODS) ×1 IMPLANT
SLING ARM FOAM STRAP XLG (SOFTGOODS) ×1 IMPLANT
SLING ARM IMMOBILIZER LRG (SOFTGOODS) ×1 IMPLANT
SPONGE LAP 18X18 X RAY DECT (DISPOSABLE) ×4 IMPLANT
SPONGE LAP 4X18 X RAY DECT (DISPOSABLE) IMPLANT
STRIP CLOSURE SKIN 1/2X4 (GAUZE/BANDAGES/DRESSINGS) ×2 IMPLANT
SUCTION FRAZIER TIP 10 FR DISP (SUCTIONS) ×2 IMPLANT
SUT BONE WAX W31G (SUTURE) IMPLANT
SUT FIBERWIRE #2 38 T-5 BLUE (SUTURE) ×4
SUT MNCRL AB 3-0 PS2 18 (SUTURE) ×4 IMPLANT
SUT VIC AB 1 CT1 27 (SUTURE) ×6
SUT VIC AB 1 CT1 27XBRD ANBCTR (SUTURE) ×3 IMPLANT
SUT VIC AB 2-0 CT1 27 (SUTURE) ×4
SUT VIC AB 2-0 CT1 TAPERPNT 27 (SUTURE) ×2 IMPLANT
SUT VIC AB 2-0 SH 27 (SUTURE)
SUT VIC AB 2-0 SH 27X BRD (SUTURE) IMPLANT
SUTURE FIBERWR #2 38 T-5 BLUE (SUTURE) ×2 IMPLANT
SYR 30ML SLIP (SYRINGE) ×2 IMPLANT
SYR CONTROL 10ML LL (SYRINGE) IMPLANT
TOWEL OR 17X24 6PK STRL BLUE (TOWEL DISPOSABLE) ×2 IMPLANT
TOWEL OR 17X26 10 PK STRL BLUE (TOWEL DISPOSABLE) ×2 IMPLANT
TOWER CARTRIDGE SMART MIX (DISPOSABLE) IMPLANT
TRAY FOLEY CATH 16FRSI W/METER (SET/KITS/TRAYS/PACK) IMPLANT
WATER STERILE IRR 1000ML POUR (IV SOLUTION) ×2 IMPLANT

## 2013-01-07 NOTE — Addendum Note (Signed)
Addendum created 01/07/13 1033 by Quentin Ore, CRNA   Modules edited: Anesthesia Medication Administration

## 2013-01-07 NOTE — Op Note (Signed)
01/07/2013  9:26 AM  PATIENT:   [name]  79 y.o. female  PRE-OPERATIVE DIAGNOSIS:  RIGHT SHOULDER ROTATOR CUFF TEAR ARTHROPATHY   POST-OPERATIVE DIAGNOSIS:  same  PROCEDURE:  Right reverse shoulder arthroplasty, cemented 12/2 stem, +6 poly, 42 eccentric glenosphere  SURGEON:  Ane Conerly, Vania Rea M.D.  ASSISTANTS: Shuford pac   ANESTHESIA:   GET + ISB  EBL: 200  SPECIMEN:  none  Drains: none   PATIENT DISPOSITION:  PACU - hemodynamically stable.    PLAN OF CARE: Admit to inpatient   Dictation# [id number]

## 2013-01-07 NOTE — Care Management Utilization Note (Signed)
Utilization review completed. Johnesha Acheampong, RN BSN 

## 2013-01-07 NOTE — Anesthesia Procedure Notes (Addendum)
Anesthesia Regional Block:  Interscalene brachial plexus block  Pre-Anesthetic Checklist: ,, timeout performed, Correct Patient, Correct Site, Correct Laterality, Correct Procedure, Correct Position, site marked, Risks and benefits discussed,  Surgical consent,  Pre-op evaluation,  At surgeon's request and post-op pain management  Laterality: Right  Prep: chloraprep and alcohol swabs       Needles:  Injection technique: Single-shot  Needle Type: Stimulator Needle - 40        Needle insertion depth: 3 cm   Additional Needles:  Procedures: nerve stimulator Interscalene brachial plexus block  Nerve Stimulator or Paresthesia:  Response: 0.5 mA, 0.1 ms, 3 cm  Additional Responses:   Narrative:  Start time: 01/07/2013 6:55 AM End time: 01/07/2013 7:00 AM Injection made incrementally with aspirations every 5 mL.  Performed by: Personally  Anesthesiologist: Maren Beach MD  Additional Notes: Pt accepts procedure w/ risks. 12 cc 0.5% Marcaine w/ epi w/o difficulty or discomfort. GES   Procedure Name: Intubation Date/Time: 01/07/2013 7:38 AM Performed by: Sharlene Dory E Pre-anesthesia Checklist: Patient identified, Emergency Drugs available, Suction available, Patient being monitored and Timeout performed Patient Re-evaluated:Patient Re-evaluated prior to inductionOxygen Delivery Method: Circle system utilized Preoxygenation: Pre-oxygenation with 100% oxygen Intubation Type: IV induction Ventilation: Mask ventilation without difficulty Laryngoscope Size: Mac and 3 Grade View: Grade II Tube type: Oral Tube size: 7.0 mm Number of attempts: 1 Airway Equipment and Method: Stylet and LTA kit utilized Placement Confirmation: ETT inserted through vocal cords under direct vision,  positive ETCO2 and breath sounds checked- equal and bilateral Secured at: 21 cm Tube secured with: Tape

## 2013-01-07 NOTE — Transfer of Care (Signed)
Immediate Anesthesia Transfer of Care Note  Patient: [name]  Procedure(s) Performed: Procedure(s): RIGHT REVERSE SHOULDER ARTHROPLASTY (Right)  Patient Location: PACU  Anesthesia Type:General  Level of Consciousness: awake, alert  and oriented  Airway & Oxygen Therapy: Patient Spontanous Breathing and Patient connected to nasal cannula oxygen  Post-op Assessment: Report given to PACU RN, Post -op Vital signs reviewed and stable and Patient moving all extremities X 4  Post vital signs: Reviewed and stable  Complications: No apparent anesthesia complications

## 2013-01-07 NOTE — Progress Notes (Signed)
Call to Dr. Rennis Chris, reported pt. Has hives with PCN in past. No new orders furnished.

## 2013-01-07 NOTE — Op Note (Signed)
[name], [name] NO.:  [id number]  MEDICAL RECORD NO.:  [id number]  LOCATION:  5N05C                        FACILITY:  [facility]  PHYSICIAN:  Vania Rea. Tenia Goh, M.D.  DATE OF BIRTH:  [birth year]  DATE OF PROCEDURE:  01/07/2013 DATE OF DISCHARGE:                              OPERATIVE REPORT   PREOPERATIVE DIAGNOSIS:  End-stage right shoulder rotator cuff tear arthropathy.  POSTOPERATIVE DIAGNOSIS:  End-stage right shoulder rotator cuff tear arthropathy.  PROCEDURE:  Right reverse shoulder arthroplasty utilizing a cemented size 12/2 DePuy stem with a +6 poly and a 42 eccentric glenosphere.  SURGEON:  Vania Rea. Timothey Dahlstrom, M.D.  Threasa HeadsFrench Ana A. Shuford, PA-C.  ANESTHESIA:  General endotracheal as well as an interscalene block.  ESTIMATED BLOOD LOSS:  200 mL.  DRAINS:  None.  HISTORY:  Ms. Fischler is an 79 year old female who has had chronic and progressive increasing right shoulder pain with functional limitations due to an end-stage rotator cuff tear arthropathy.  She is brought to the operating room at this time for planned right shoulder reverse arthroplasty.  Preoperatively, I counseled Ms. Kovalenko on treatment options as well as risks versus benefits thereof.  Possible surgical complications were reviewed including the potential for bleeding, infection, neurovascular injury, persistent pain, loss of motion, failure of the implant, anesthetic complication, possible need for additional surgery.  She understands and accepts and agrees with our planned procedure.  PROCEDURE IN DETAIL:  After undergoing routine preop evaluation, the patient received prophylactic antibiotics and an interscalene block was established in the holding area by the Anesthesia Department.  Placed supine on the operating room table, underwent smooth induction of general endotracheal anesthesia.  Placed into beach-chair position and appropriately padded and protected.  The right  shoulder girdle region was then sterilely prepped and draped in standard fashion.  Time-out was called.  An anterior deltopectoral approach to the right shoulder was made through an approximately 12 cm incision.  Skin flaps were elevated and electrocautery was used for hemostasis.  Dissection carried deeply, interval was then identified, cephalic vein retracted laterally to the deltoid, interval developed bluntly.  The upper cm of the pec major was tenotomized to enhance exposure.  I divided adhesions in the subacromial/subdeltoid bursal region and then mobilized the conjoined tendon, retracted this medially.  At this point, the subscapularis was then divided away from the lesser tuberosity.  Biceps had previously ruptured.  We tagged the free margin of the subscap with a pair of #2 FiberWires in a grasping manner.  This was then retracted medially.  We dissected the capsular tissues from the anterior and inferior aspects of the humeral head allowing delivery of the humeral head completely to the wound.  There was obvious complete absence of the rotator cuff superiorly and posterosuperiorly.  With appropriate exposure, we then gained access to the humeral medullary canal and performed hand reaming up to size 12.  Of the size 12 intramedullary guide, we then made our humeral head resection at 0 degrees retroversion using an oscillating saw.  Once this was completed, we placed a metal cap over the cut proximal humerus.  We then turned our attention to the glenoid.  This was exposed with a combination of Fukuda snake tongue and pitchfork retractors.  We performed a circumferential labral resection.  We released the capsule anteriorly and made sure that the subscap was completely mobilized.  The superior aspects of the remnants of the rotator cuff were removed.  We divided the capsular tissues inferiorly and gained circumferential exposure of the glenoid much to our satisfaction.  At this  point, a guide pin was then placed into the center of the glenoid.  We reamed to subchondral bony bed.  Peripheral reamer was then used to remove the remaining bone at the margins and the reaming was centered over the inferior 2/3rds of the glenoid.  We then placed our central drill hole and after cleaning the soft tissues from the periphery of the glenoid, we impacted our glenoid base plate.  This was then fastened using standard technique with locking screws inferiorly and superiorly, nonlocking screws anteriorly and posteriorly, all with excellent purchase and fixation.  The locking screws were then terminally tightened.  At this point, over a guidewire we placed a 42 eccentric glenosphere and this was then sequentially tightened, impacted, retightened such that we had secure fixation of the glenosphere.  At this point, we returned our attention to the proximal humerus and of the impacted size 12 stem, we performed a reaming with a size 2 metaphyseal reamer at 0 degrees of retroversion.  We then performed a trial reduction with the size 12 stem, and confirmed that we had appropriate soft tissue balance.  At this point, we placed a distal cement plug, irrigated the canal, cleaned and dried the canal.  Cement was mixed, introduced into the canal in retrograde fashion.  After cement plug had been placed, then our size 12 stem was introduced to gain 0 degrees of retroversion and all extra cement was meticulously removed.  This cement was allowed to harden.  At this point, we again performed trial reductions and ultimately the +6 poly showed the best soft tissue balance.  At this point, we meticulously cleaned the metaphyseal segment of our stem, the final +6 poly was impacted into position.  We performed a final reduction, which showed good soft tissue balance, excellent stability, and good motion of the shoulder.  At this point, we then copiously irrigated the joint, obtained  meticulous hemostasis.  We then reattached the subscapularis back to the anterior metaphyseal region of the humeral shaft with #2 FiberWires much to our satisfaction.  The deltopectoral interval was then reapproximated with a series of figure-of-eight #1 Vicryl sutures.  2-0 Vicryl used for the subcu layer and intracuticular 3-0 Monocryl for the skin followed by Steri-Strips.  Dry dressing was applied.  The right arm was placed in a sling.  The patient was awakened, extubated, and taken to the recovery room in stable condition.  Tracy A. Shuford, PA-C was used as an Geophysicist/field seismologist throughout this case and essential for help with positioning of the extremity, retraction, tissue manipulation, placement of the implant, wound closure, and intraoperative decision making.     Vania Rea. Nelson Julson, M.D.     KMS/MEDQ  D:  01/07/2013  T:  01/07/2013  Job:  [id number]

## 2013-01-07 NOTE — Anesthesia Preprocedure Evaluation (Signed)
Anesthesia Evaluation  Patient identified by MRN, date of birth, ID band Patient awake    Reviewed: Allergy & Precautions, H&P , NPO status , Patient's Chart, lab work & pertinent test results  Airway       Dental   Pulmonary          Cardiovascular hypertension, + Peripheral Vascular Disease     Neuro/Psych    GI/Hepatic GERD-  ,  Endo/Other  Hypothyroidism   Renal/GU      Musculoskeletal   Abdominal   Peds  Hematology   Anesthesia Other Findings   Reproductive/Obstetrics                           Anesthesia Physical Anesthesia Plan  ASA: II  Anesthesia Plan: General   Post-op Pain Management:    Induction: Intravenous  Airway Management Planned: Oral ETT  Additional Equipment:   Intra-op Plan:   Post-operative Plan: Extubation in OR  Informed Consent: I have reviewed the patients History and Physical, chart, labs and discussed the procedure including the risks, benefits and alternatives for the proposed anesthesia with the patient or authorized representative who has indicated his/her understanding and acceptance.     Plan Discussed with: CRNA, Anesthesiologist and Surgeon  Anesthesia Plan Comments:         Anesthesia Quick Evaluation

## 2013-01-07 NOTE — H&P (Signed)
Adelise S Ingram    Chief Complaint: RIGHT SHOULDER ROTATOR CUFF TEAR ARTHROPATHY  HPI: The patient is a 79 y.o. female with end stage right shoulder rotator cuff tear arthropathy  Past Medical History  Diagnosis Date  . GERD (gastroesophageal reflux disease)   . Osteoporosis   . Hypothyroidism   . Scoliosis   . Varicose veins   . Hypertension   . Hx of blood clots 60's    eye rt  . Allergic cough   . Heart murmur   . Peripheral vascular disease     blood clot behind rt eye   . Arthritis     Past Surgical History  Procedure Laterality Date  . Total hip arthroplasty  1981,1982,2001,2007,    Right and Left    Family History  Problem Relation Age of Onset  . Breast cancer Daughter   . Heart attack Brother   . Stroke Mother   . Rheum arthritis Father   . Lung cancer Father   . Lung cancer Sister     Social History:  reports that she has never smoked. She has never used smokeless tobacco. She reports that she does not drink alcohol or use illicit drugs.  Allergies:  Allergies  Allergen Reactions  . Penicillins     REACTION: hives  . Sulfonamide Derivatives     REACTION: hives    Medications Prior to Admission  Medication Sig Dispense Refill  . Ascorbic Acid (VITAMIN C) 1000 MG tablet Take 1,000 mg by mouth daily.       . calcium citrate-vitamin D (CITRACAL+D) 315-200 MG-UNIT per tablet Take 1 tablet by mouth daily.       . Cyanocobalamin (B-12) 1000 MCG TBCR Take 1 tablet by mouth daily.       . ergocalciferol (VITAMIN D2) 50000 UNITS capsule Take 50,000 Units by mouth every 30 (thirty) days.       . hydrochlorothiazide (HYDRODIURIL) 25 MG tablet Take 25 mg by mouth daily.      Marland Kitchen levothyroxine (SYNTHROID, LEVOTHROID) 100 MCG tablet Take 100 mcg by mouth daily.       . metoprolol (TOPROL-XL) 50 MG 24 hr tablet Take 50 mg by mouth 2 (two) times daily.       . Multiple Vitamin (MULTIVITAMIN) tablet Take 1 tablet by mouth daily.        . NON FORMULARY Take 1 tablet by  mouth 2 (two) times daily. Macular Degeneration Supp. Take 1 tab by mouth twice daily.      Marland Kitchen omeprazole (PRILOSEC) 20 MG capsule Take 20 mg by mouth every other day.       Carlena Hurl 15 MG TABS tablet Take 1 tablet by mouth daily.         Physical Exam: right shoulder with ;painful and restricted motion as noted at recent office visits  Vitals  Temp:  [97 F (36.1 C)] 97 F (36.1 C) (11/20 0619) Pulse Rate:  [84] 84 (11/20 0619) Resp:  [20] 20 (11/20 0619) BP: (144)/(74) 144/74 mmHg (11/20 0619) SpO2:  [98 %] 98 % (11/20 0619)  Assessment/Plan  Impression: RIGHT SHOULDER ROTATOR CUFF TEAR ARTHROPATHY   Plan of Action: Procedure(s): RIGHT REVERSE SHOULDER ARTHROPLASTY  Encarnacion Bole M 01/07/2013, 7:27 AM

## 2013-01-07 NOTE — Preoperative (Signed)
Beta Blockers   Reason not to administer Beta Blockers:BB taken 01/07/13 at 4:30 am.

## 2013-01-07 NOTE — Anesthesia Postprocedure Evaluation (Signed)
  Anesthesia Post-op Note  Patient: [name]  Procedure(s) Performed: Procedure(s): RIGHT REVERSE SHOULDER ARTHROPLASTY (Right)  Patient Location: PACU  Anesthesia Type:General and GA combined with regional for post-op pain  Level of Consciousness: awake, oriented, sedated and patient cooperative  Airway and Oxygen Therapy: Patient Spontanous Breathing  Post-op Pain: none  Post-op Assessment: Post-op Vital signs reviewed, Patient's Cardiovascular Status Stable, Respiratory Function Stable, Patent Airway, No signs of Nausea or vomiting and Pain level controlled  Post-op Vital Signs: stable  Complications: No apparent anesthesia complications

## 2013-01-08 MED ORDER — ONDANSETRON HCL 4 MG PO TABS: 4.0000 mg | ORAL_TABLET | Freq: Four times a day (QID) | ORAL | Status: DC | PRN

## 2013-01-08 MED ORDER — DIAZEPAM 2 MG PO TABS: 2.0000 mg | ORAL_TABLET | Freq: Four times a day (QID) | ORAL | Status: DC | PRN

## 2013-01-08 MED ORDER — HYDROCODONE-ACETAMINOPHEN 5-325 MG PO TABS: 1.0000 | ORAL_TABLET | ORAL | Status: DC | PRN

## 2013-01-08 MED ORDER — HYDROCODONE-ACETAMINOPHEN 5-325 MG PO TABS
1.0000 | ORAL_TABLET | ORAL | Status: DC | PRN
  Administered 2013-01-08 – 2013-01-09 (×5): 1 via ORAL
  Filled 2013-01-08 (×3): qty 1
  Filled 2013-01-08 (×2): qty 2

## 2013-01-08 NOTE — Evaluation (Addendum)
Occupational Therapy Evaluation Patient Details Name: [name] MRN: [id number] DOB: 1923-03-27 Today's Date: 01/08/2013 Time: 2130-8657 OT Time Calculation (min): 45 min  OT Assessment / Plan / Recommendation History of present illness Pt admitted for R reverse total shoulder arthoplasty.   Clinical Impression   Pt admitted for above diagnosis and is doing fairly well with exercises and adls. Daughter has been trained on how to assist with adls and how to assist with exercises.  Pt would benefit from cont OT to increase I with basic adls and to increase ROM in RUE to functional level.     OT Assessment  Patient needs continued OT Services    Follow Up Recommendations  Home health OT;Supervision/Assistance - 24 hour    Barriers to Discharge      Equipment Recommendations  None recommended by OT    Recommendations for Other Services PT consult  Frequency  Min 2X/week    Precautions / Restrictions Precautions Precautions: Shoulder Type of Shoulder Precautions: AAROM shoulder flex to 90, AAROM abduction to 60, AAROM external rotation to 20, IR rotation to abdomen. Shoulder Interventions: Shoulder sling/immobilizer;For comfort;Off for dressing/bathing/exercises Precaution Booklet Issued: Yes (comment) Precaution Comments: handout provided on precautions and adl techniques. Required Braces or Orthoses: Sling Restrictions Weight Bearing Restrictions: Yes LUE Weight Bearing: Non weight bearing   Pertinent Vitals/Pain Pt with 6/10 pain with movement.    ADL  Eating/Feeding: Simulated;Minimal assistance Where Assessed - Eating/Feeding: Chair Grooming: Performed;Minimal assistance Where Assessed - Grooming: Supported sitting Upper Body Bathing: Simulated;Moderate assistance Where Assessed - Upper Body Bathing: Supported sitting Lower Body Bathing: Simulated;Minimal assistance Where Assessed - Lower Body Bathing: Supported sit to stand Upper Body Dressing:  Performed;Moderate assistance Where Assessed - Upper Body Dressing: Supported sitting Lower Body Dressing: Performed;Moderate assistance Where Assessed - Lower Body Dressing: Supported sit to stand Toilet Transfer: Performed;Minimal assistance Toilet Transfer Method: Other (comment);Stand pivot (walked to bathroom) Toilet Transfer Equipment: Comfort height toilet;Grab bars Toileting - Clothing Manipulation and Hygiene: Performed;Minimal assistance Where Assessed - Toileting Clothing Manipulation and Hygiene: Standing Equipment Used: Cane Transfers/Ambulation Related to ADLs: Pt walked in room with cane and hand held assist.  Pt not used to using cane in L hand.  ADL Comments: Pt sick to her stomach today so was unable to tolerate too many adls.  Feel she will do well once feeling better.  Pt and daughter educated in all adl techniques.    OT Diagnosis: Generalized weakness;Acute pain  OT Problem List: Decreased range of motion;Decreased activity tolerance;Decreased strength;Pain;Impaired UE functional use OT Treatment Interventions: Self-care/ADL training;Therapeutic exercise;Therapeutic activities   OT Goals(Current goals can be found in the care plan section) Acute Rehab OT Goals Patient Stated Goal: to not be sick to my stomach OT Goal Formulation: With patient Time For Goal Achievement: 01/15/13 Potential to Achieve Goals: Good ADL Goals Pt Will Perform Upper Body Dressing: with supervision;sitting Pt Will Perform Tub/Shower Transfer: ambulating;shower seat;with supervision;Shower transfer Pt/caregiver will Perform Home Exercise Program: Increased ROM;Left upper extremity;With Supervision;With written HEP provided Additional ADL Goal #1: Pt will toilet with comfort height commode and S.  Visit Information  Last OT Received On: 01/08/13 Assistance Needed: +1 History of Present Illness: Pt admitted for R reverse total shoulder arthoplasty.       Prior Functioning     Home  Living Family/patient expects to be discharged to:: Private residence Living Arrangements: Children Available Help at Discharge: Family;Available 24 hours/day Type of Home: House Home Access: Stairs to enter [employer] of  Steps: 2 Entrance Stairs-Rails: None Home Layout: One level Home Equipment: Cane - single point;Shower seat - built in Prior Function Level of Independence: Independent with assistive device(s) Comments: uses cane to walk.  Accustomed to using it in R hand. Communication Communication: No difficulties Dominant Hand: Right         Vision/Perception Vision - History Baseline Vision: No visual deficits Vision - Assessment Vision Assessment: Vision not tested Perception Perception: Within Functional Limits Praxis Praxis: Intact   Cognition  Cognition Arousal/Alertness: Awake/alert Behavior During Therapy: WFL for tasks assessed/performed Overall Cognitive Status: Within Functional Limits for tasks assessed    Extremity/Trunk Assessment Upper Extremity Assessment Upper Extremity Assessment: RUE deficits/detail RUE Deficits / Details: Shoulder PROM to 90, abduction to 60, elbow, wrist and hand WFL RUE: Unable to fully assess due to pain;Unable to fully assess due to immobilization RUE Coordination: decreased fine motor;decreased gross motor Lower Extremity Assessment Lower Extremity Assessment: Defer to PT evaluation Cervical / Trunk Assessment Cervical / Trunk Assessment: Normal     Mobility Bed Mobility Bed Mobility: Not assessed Details for Bed Mobility Assistance: Pt in chair on arrival. Transfers Transfers: Sit to Stand;Stand to Sit Sit to Stand: 4: Min assist;From chair/3-in-1 Stand to Sit: 4: Min assist;To chair/3-in-1 Details for Transfer Assistance: Min assist just to get balance.  Pt uses cane and needs to practice with cane in L hand.     Exercise Shoulder Exercises Pendulum Exercise: PROM;Standing;5 reps;Right Shoulder  Flexion: AAROM;Right;10 reps;Other (comment) (to 90 degrees) Shoulder ABduction: AAROM;10 reps;Right;Seated;Other (comment) (to 60 degrees) Shoulder External Rotation: AAROM;Both;10 reps;Seated (to 10 degrees (unable to make it to 20 )) Elbow Flexion: AROM;Right;10 reps;Seated Elbow Extension: AROM;10 reps;Seated;Right Wrist Flexion: AROM;Right;10 reps;Seated Wrist Extension: AROM;Right;10 reps;Seated Digit Composite Flexion: AROM;Right;10 reps;Seated Composite Extension: AROM;Right;10 reps;Seated Donning/doffing shirt without moving shoulder: Caregiver independent with task;Moderate assistance Method for sponge bathing under operated UE: Minimal assistance;Caregiver independent with task Donning/doffing sling/immobilizer: Minimal assistance;Caregiver independent with task Correct positioning of sling/immobilizer: Minimal assistance;Caregiver independent with task Pendulum exercises (written home exercise program): Minimal assistance;Caregiver independent with task ROM for elbow, wrist and digits of operated UE: Independent Sling wearing schedule (on at all times/off for ADL's): Independent Proper positioning of operated UE when showering: Independent Positioning of UE while sleeping: Independent   Balance Balance Balance Assessed: No   End of Session OT - End of Session Activity Tolerance: Patient tolerated treatment well;Patient limited by pain Patient left: in chair;with call bell/phone within reach;with family/visitor present Nurse Communication: Mobility status;Weight bearing status;Precautions  GO     Hope Budds 01/08/2013, 10:43 AM

## 2013-01-08 NOTE — Progress Notes (Signed)
[name]  MRN: [id number] DOB/Age: Jan 23, 1924 79 y.o. Physician: Jacquelyne Balint Procedure: Procedure(s) (LRB): RIGHT REVERSE SHOULDER ARTHROPLASTY (Right)     Subjective: Nausea and vomiting episodes. Pain as expected. Tolerates hydrocodone better in past  Vital Signs Temp:  [97.2 F (36.2 C)-98.4 F (36.9 C)] 97.9 F (36.6 C) (11/21 0546) Pulse Rate:  [45-85] 71 (11/21 0546) Resp:  [14-18] 18 (11/21 0546) BP: (117-156)/(62-89) 125/70 mmHg (11/21 0546) SpO2:  [97 %-100 %] 97 % (11/21 0546) Weight:  [73.891 kg (162 lb 14.4 oz)] 73.891 kg (162 lb 14.4 oz) (11/20 1300)  Lab Results No results found for this basename: WBC, HGB, HCT, PLT,  in the last 72 hours BMET No results found for this basename: NA, K, CL, CO2, GLUCOSE, BUN, CREATININE, CALCIUM,  in the last 72 hours No results found for this basename: inr     Exam Right shoulder dressing dry NVI Alert and oriented, nauseated        Plan Will change meds. Also ordered PT consult and home health resources Will see how she is doing later today but suspect will need to stay til tomorrow am, all rxs in chart and dc instructions given pt and daughter  [facility] for Dr.Kevin Supple 01/08/2013, 8:48 AM

## 2013-01-08 NOTE — Evaluation (Signed)
Physical Therapy Evaluation Patient Details Name: [name] MRN: [id number] DOB: 02/27/1923 Today's Date: 01/08/2013 Time: 4098-1191 PT Time Calculation (min): 19 min  PT Assessment / Plan / Recommendation History of Present Illness  Pt admitted for R reverse total shoulder arthoplasty.  Clinical Impression  This patient presents with acute pain and decreased functional independence following the above mentioned procedure. At the time of PT eval, pt required min assist for balance while ambulating with SPC in left hand. Pt required increased cueing to avoid holding cane in R hand while performing transfers. This patient is appropriate for skilled PT interventions to address functional limitations, improve safety and independence with functional mobility, and return to PLOF.    PT Assessment  Patient needs continued PT services    Follow Up Recommendations  Home health PT    Does the patient have the potential to tolerate intense rehabilitation      Barriers to Discharge        Equipment Recommendations  3in1 (PT)    Recommendations for Other Services     Frequency Min 5X/week    Precautions / Restrictions Precautions Precautions: Shoulder;Fall Type of Shoulder Precautions: AAROM shoulder flex to 90, AAROM abduction to 60, AAROM external rotation to 20, IR rotation to abdomen. Shoulder Interventions: Shoulder sling/immobilizer;For comfort;Off for dressing/bathing/exercises Precaution Booklet Issued: Yes (comment) Required Braces or Orthoses: Sling Restrictions Weight Bearing Restrictions: Yes LUE Weight Bearing: Non weight bearing   Pertinent Vitals/Pain 4/10 at rest      Mobility  Bed Mobility Bed Mobility: Not assessed Details for Bed Mobility Assistance: Pt received up in recliner Transfers Transfers: Sit to Stand;Stand to Sit Sit to Stand: 4: Min guard;From chair/3-in-1;With upper extremity assist Stand to Sit: 4: Min guard;To chair/3-in-1;With upper  extremity assist Details for Transfer Assistance: Pt cued not to try and hold cane in R hand while using left hand to push from. Min assist to steady pt upon coming to full stand. Ambulation/Gait Ambulation/Gait Assistance: 4: Min assist Ambulation Distance (Feet): 125 Feet Assistive device: Straight cane Ambulation/Gait Assistance Details: 2 lateral LOB episodes, requiring min assist to recover. Pt slightly unsteady with cane and moves slowly.  Gait Pattern: Step-through pattern;Decreased stride length Gait velocity: Decreased Stairs: Yes Stairs Assistance: 4: Min assist Stairs Assistance Details (indicate cue type and reason): With use of cane in L hand as there are no rails present at home. VC's for sequencing and safety awareness. Stair Management Technique: Step to pattern;Forwards;With cane Number of Stairs: 2 Wheelchair Mobility Wheelchair Mobility: No    Exercises     PT Diagnosis: Acute pain;Difficulty walking  PT Problem List: Decreased strength;Decreased range of motion;Decreased activity tolerance;Decreased balance;Decreased mobility;Decreased knowledge of use of DME;Decreased safety awareness;Pain;Decreased knowledge of precautions PT Treatment Interventions: DME instruction;Gait training;Stair training;Functional mobility training;Therapeutic activities;Therapeutic exercise;Neuromuscular re-education;Patient/family education     PT Goals(Current goals can be found in the care plan section) Acute Rehab PT Goals Patient Stated Goal: To return home at d/c PT Goal Formulation: With patient/family Time For Goal Achievement: 01/15/13 Potential to Achieve Goals: Good  Visit Information  Last PT Received On: 01/08/13 Assistance Needed: +1 History of Present Illness: Pt admitted for R reverse total shoulder arthoplasty.       Prior Functioning  Home Living Family/patient expects to be discharged to:: Private residence Living Arrangements: Children Available Help at  Discharge: Family;Available 24 hours/day Type of Home: House Home Access: Stairs to enter [employer] of Steps: 2 Entrance Stairs-Rails: None Home Layout: One level Home  Equipment: Cane - single point;Shower seat - built in Prior Function Level of Independence: Independent with assistive device(s) Comments: uses cane to walk.  Accustomed to using it in R hand. Communication Communication: No difficulties Dominant Hand: Right    Cognition  Cognition Arousal/Alertness: Awake/alert Behavior During Therapy: WFL for tasks assessed/performed Overall Cognitive Status: Within Functional Limits for tasks assessed    Extremity/Trunk Assessment Upper Extremity Assessment Upper Extremity Assessment: Defer to OT evaluation Lower Extremity Assessment Lower Extremity Assessment: Overall WFL for tasks assessed Cervical / Trunk Assessment Cervical / Trunk Assessment: Normal   Balance Balance Balance Assessed: Yes Static Sitting Balance Static Sitting - Balance Support: Feet supported;No upper extremity supported Static Sitting - Level of Assistance: 5: Stand by assistance Static Standing Balance Static Standing - Balance Support: Left upper extremity supported Static Standing - Level of Assistance: 4: Min assist  End of Session PT - End of Session Equipment Utilized During Treatment: Gait belt;Other (comment) (Shoulder sling) Activity Tolerance: Patient tolerated treatment well Patient left: in chair;with call bell/phone within reach;with family/visitor present Nurse Communication: Mobility status  GP     Ruthann Cancer 01/08/2013, 2:38 PM  Ruthann Cancer, PT, DPT [phone]

## 2013-01-08 NOTE — Progress Notes (Signed)
01/08/13  Spoke with patient and her daughter about HHC. They chose [facility]. Contacted Danaisha Celli with [facility] and set up HHPT and HHOT. Patient has a cane, no equipment needs identified. Patient will be staying with her  daughter, Sharla Kidney [phone], [address], [place], informed [facility]nO other d/c needs identified. Jacquelynn Cree RN, BSN, CCM

## 2013-01-09 NOTE — Progress Notes (Signed)
Rn reviewed discharge instructions with patient and family. Patient stated understanding if discharge instructions and prescriptions medications. RN discussed the importance of taking the blood thinner, Xarelto, at the same time each day. Patient wheeled down in wheelchair with NT, and left with daughter to private vehicle.

## 2013-01-09 NOTE — Progress Notes (Signed)
   Subjective: 2 Days Post-Op Procedure(s) (LRB): RIGHT REVERSE SHOULDER ARTHROPLASTY (Right)   Patient reports pain as mild, pain well controlled. No events throughout the night. Exercising in the recliner.  Ready to be discharged home.  Objective:   VITALS:   Filed Vitals:   01/09/13 0615  BP: 101/50  Pulse: 67  Temp: 97.3 F (36.3 C)  Resp: 18    Neurovascular intact Dorsiflexion/Plantar flexion intact Incision: dressing C/D/I No cellulitis present Compartment soft  LABS No new labs  Assessment/Plan: 2 Days Post-Op Procedure(s) (LRB): RIGHT REVERSE SHOULDER ARTHROPLASTY (Right) Up with therapy Discharge home with home health Follow up with Dr. Rennis Chris in the clinic      Anastasio Auerbach. Jakari Sada   PAC  01/09/2013, 8:17 AM

## 2013-01-10 DIAGNOSIS — Z602 Problems related to living alone: Secondary | ICD-10-CM | POA: Diagnosis not present

## 2013-01-10 DIAGNOSIS — Z7901 Long term (current) use of anticoagulants: Secondary | ICD-10-CM | POA: Diagnosis not present

## 2013-01-10 DIAGNOSIS — IMO0001 Reserved for inherently not codable concepts without codable children: Secondary | ICD-10-CM | POA: Diagnosis not present

## 2013-01-10 DIAGNOSIS — Z471 Aftercare following joint replacement surgery: Secondary | ICD-10-CM | POA: Diagnosis not present

## 2013-01-10 DIAGNOSIS — Z96619 Presence of unspecified artificial shoulder joint: Secondary | ICD-10-CM | POA: Diagnosis not present

## 2013-01-11 DIAGNOSIS — Z471 Aftercare following joint replacement surgery: Secondary | ICD-10-CM | POA: Diagnosis not present

## 2013-01-11 DIAGNOSIS — Z602 Problems related to living alone: Secondary | ICD-10-CM | POA: Diagnosis not present

## 2013-01-11 DIAGNOSIS — Z7901 Long term (current) use of anticoagulants: Secondary | ICD-10-CM | POA: Diagnosis not present

## 2013-01-11 DIAGNOSIS — IMO0001 Reserved for inherently not codable concepts without codable children: Secondary | ICD-10-CM | POA: Diagnosis not present

## 2013-01-11 DIAGNOSIS — Z96619 Presence of unspecified artificial shoulder joint: Secondary | ICD-10-CM | POA: Diagnosis not present

## 2013-01-12 ENCOUNTER — Encounter (HOSPITAL_COMMUNITY): Payer: Self-pay | Admitting: Orthopedic Surgery

## 2013-01-13 ENCOUNTER — Encounter (HOSPITAL_COMMUNITY): Payer: Self-pay | Admitting: Orthopedic Surgery

## 2013-01-13 DIAGNOSIS — IMO0001 Reserved for inherently not codable concepts without codable children: Secondary | ICD-10-CM | POA: Diagnosis not present

## 2013-01-13 DIAGNOSIS — Z471 Aftercare following joint replacement surgery: Secondary | ICD-10-CM | POA: Diagnosis not present

## 2013-01-13 DIAGNOSIS — Z96619 Presence of unspecified artificial shoulder joint: Secondary | ICD-10-CM | POA: Diagnosis not present

## 2013-01-13 DIAGNOSIS — Z602 Problems related to living alone: Secondary | ICD-10-CM | POA: Diagnosis not present

## 2013-01-13 DIAGNOSIS — Z7901 Long term (current) use of anticoagulants: Secondary | ICD-10-CM | POA: Diagnosis not present

## 2013-01-18 DIAGNOSIS — IMO0001 Reserved for inherently not codable concepts without codable children: Secondary | ICD-10-CM | POA: Diagnosis not present

## 2013-01-18 DIAGNOSIS — Z96619 Presence of unspecified artificial shoulder joint: Secondary | ICD-10-CM | POA: Diagnosis not present

## 2013-01-18 DIAGNOSIS — Z7901 Long term (current) use of anticoagulants: Secondary | ICD-10-CM | POA: Diagnosis not present

## 2013-01-18 DIAGNOSIS — Z602 Problems related to living alone: Secondary | ICD-10-CM | POA: Diagnosis not present

## 2013-01-18 DIAGNOSIS — Z471 Aftercare following joint replacement surgery: Secondary | ICD-10-CM | POA: Diagnosis not present

## 2013-01-18 NOTE — Discharge Summary (Signed)
PATIENT ID:      [name]  MRN:     [id number] DOB/AGE:    1923/12/02 / 79 y.o.     DISCHARGE SUMMARY  ADMISSION DATE:    01/07/2013 DISCHARGE DATE:  01/09/2013  ADMISSION DIAGNOSIS: RIGHT SHOULDER ROTATOR CUFF TEAR ARTHROPATHY  Past Medical History  Diagnosis Date  . GERD (gastroesophageal reflux disease)   . Osteoporosis   . Hypothyroidism   . Scoliosis   . Varicose veins   . Hypertension   . Hx of blood clots 60's    eye rt  . Allergic cough   . Heart murmur   . Peripheral vascular disease     blood clot behind rt eye   . Arthritis     DISCHARGE DIAGNOSIS:   Active Problems:   * No active hospital problems. *   PROCEDURE: Procedure(s): RIGHT REVERSE SHOULDER ARTHROPLASTY on 01/07/2013  CONSULTS:     HISTORY:  See H&P in chart.  HOSPITAL COURSE:  [name] is a 79 y.o. admitted on 01/07/2013 with a chief complaint of No chief complaint on file. , and found to have a diagnosis of RIGHT SHOULDER ROTATOR CUFF TEAR ARTHROPATHY .  They were brought to the operating room on 01/07/2013 and underwent Procedure(s): RIGHT REVERSE SHOULDER ARTHROPLASTY.    They were given perioperative antibiotics:  Anti-infectives   Start     Dose/Rate Route Frequency Ordered Stop   01/07/13 1330  ceFAZolin (ANCEF) IVPB 1 g/50 mL premix     1 g 100 mL/hr over 30 Minutes Intravenous Every 6 hours 01/07/13 1101 01/08/13 0135   01/07/13 0600  ceFAZolin (ANCEF) IVPB 2 g/50 mL premix     2 g 100 mL/hr over 30 Minutes Intravenous On call to O.R. 01/06/13 1423 01/07/13 0745    .  Patient underwent the above named procedure and tolerated it well. The following day they were hemodynamically stable and pain was controlled on oral analgesics however some of the meds had caused significant nausea and vomiting.These were changed with improved tolerance however she required an additional day stay to tolerate oral foods. They were neurovascularly intact to the operative extremity. OT was  ordered and worked with patient per protocol. Home health was also arranged They were medically and orthopaedically stable for discharge on 01/09/2013.     DIAGNOSTIC STUDIES:  RECENT RADIOGRAPHIC STUDIES :  No results found.  RECENT VITAL SIGNS:  No data found. Marland Kitchen  RECENT EKG RESULTS:    Orders placed during the hospital encounter of 01/01/13  . EKG 12-LEAD  . EKG 12-LEAD    DISCHARGE INSTRUCTIONS:    DISCHARGE MEDICATIONS:     Medication List         B-12 1000 MCG Tbcr  Take 1 tablet by mouth daily.     calcium citrate-vitamin D 315-200 MG-UNIT per tablet  Commonly known as:  CITRACAL+D  Take 1 tablet by mouth daily.     diazepam 2 MG tablet  Commonly known as:  VALIUM  Take 1 tablet (2 mg total) by mouth every 6 (six) hours as needed for muscle spasms.     ergocalciferol 50000 UNITS capsule  Commonly known as:  VITAMIN D2  Take 50,000 Units by mouth every 30 (thirty) days.     hydrochlorothiazide 25 MG tablet  Commonly known as:  HYDRODIURIL  Take 25 mg by mouth daily.     HYDROcodone-acetaminophen 5-325 MG per tablet  Commonly known as:  NORCO/VICODIN  Take 1-2 tablets by  mouth every 4 (four) hours as needed for moderate pain.     levothyroxine 100 MCG tablet  Commonly known as:  SYNTHROID, LEVOTHROID  Take 100 mcg by mouth daily.     metoprolol succinate 50 MG 24 hr tablet  Commonly known as:  TOPROL-XL  Take 50 mg by mouth 2 (two) times daily.     multivitamin tablet  Take 1 tablet by mouth daily.     NON FORMULARY  Take 1 tablet by mouth 2 (two) times daily. Macular Degeneration Supp. Take 1 tab by mouth twice daily.     omeprazole 20 MG capsule  Commonly known as:  PRILOSEC  Take 20 mg by mouth every other day.     ondansetron 4 MG tablet  Commonly known as:  ZOFRAN  Take 1 tablet (4 mg total) by mouth every 6 (six) hours as needed for nausea.     vitamin C 1000 MG tablet  Take 1,000 mg by mouth daily.     XARELTO 15 MG Tabs tablet   Generic drug:  Rivaroxaban  Take 1 tablet by mouth daily.        FOLLOW UP VISIT:       Follow-up Information   Follow up with Senaida Lange, MD. (call to be seen in 10-14 days)    Specialty:  Orthopedic Surgery   Contact information:   [address] [place] [address] [phone]       Follow up with [facility].   Contact information:   [address] [place] [address] [phone]       DISCHARGE TO: Home  DISPOSITION: Improved  DISCHARGE CONDITION:  Rodolph Bong for Dr. Francena Hanly 01/18/2013, 12:30 PM

## 2013-01-20 DIAGNOSIS — IMO0001 Reserved for inherently not codable concepts without codable children: Secondary | ICD-10-CM | POA: Diagnosis not present

## 2013-01-20 DIAGNOSIS — Z7901 Long term (current) use of anticoagulants: Secondary | ICD-10-CM | POA: Diagnosis not present

## 2013-01-20 DIAGNOSIS — Z471 Aftercare following joint replacement surgery: Secondary | ICD-10-CM | POA: Diagnosis not present

## 2013-01-20 DIAGNOSIS — Z602 Problems related to living alone: Secondary | ICD-10-CM | POA: Diagnosis not present

## 2013-01-20 DIAGNOSIS — Z96619 Presence of unspecified artificial shoulder joint: Secondary | ICD-10-CM | POA: Diagnosis not present

## 2013-01-22 DIAGNOSIS — Z96619 Presence of unspecified artificial shoulder joint: Secondary | ICD-10-CM | POA: Diagnosis not present

## 2013-01-22 DIAGNOSIS — Z7901 Long term (current) use of anticoagulants: Secondary | ICD-10-CM | POA: Diagnosis not present

## 2013-01-22 DIAGNOSIS — Z471 Aftercare following joint replacement surgery: Secondary | ICD-10-CM | POA: Diagnosis not present

## 2013-01-22 DIAGNOSIS — IMO0001 Reserved for inherently not codable concepts without codable children: Secondary | ICD-10-CM | POA: Diagnosis not present

## 2013-01-22 DIAGNOSIS — Z602 Problems related to living alone: Secondary | ICD-10-CM | POA: Diagnosis not present

## 2013-01-25 DIAGNOSIS — IMO0001 Reserved for inherently not codable concepts without codable children: Secondary | ICD-10-CM | POA: Diagnosis not present

## 2013-01-25 DIAGNOSIS — Z7901 Long term (current) use of anticoagulants: Secondary | ICD-10-CM | POA: Diagnosis not present

## 2013-01-25 DIAGNOSIS — M19019 Primary osteoarthritis, unspecified shoulder: Secondary | ICD-10-CM | POA: Diagnosis not present

## 2013-01-25 DIAGNOSIS — Z471 Aftercare following joint replacement surgery: Secondary | ICD-10-CM | POA: Diagnosis not present

## 2013-01-25 DIAGNOSIS — Z96619 Presence of unspecified artificial shoulder joint: Secondary | ICD-10-CM | POA: Diagnosis not present

## 2013-01-25 DIAGNOSIS — Z602 Problems related to living alone: Secondary | ICD-10-CM | POA: Diagnosis not present

## 2013-01-26 DIAGNOSIS — IMO0001 Reserved for inherently not codable concepts without codable children: Secondary | ICD-10-CM | POA: Diagnosis not present

## 2013-01-26 DIAGNOSIS — Z602 Problems related to living alone: Secondary | ICD-10-CM | POA: Diagnosis not present

## 2013-01-26 DIAGNOSIS — Z96619 Presence of unspecified artificial shoulder joint: Secondary | ICD-10-CM | POA: Diagnosis not present

## 2013-01-26 DIAGNOSIS — Z471 Aftercare following joint replacement surgery: Secondary | ICD-10-CM | POA: Diagnosis not present

## 2013-01-26 DIAGNOSIS — Z7901 Long term (current) use of anticoagulants: Secondary | ICD-10-CM | POA: Diagnosis not present

## 2013-01-27 DIAGNOSIS — Z7901 Long term (current) use of anticoagulants: Secondary | ICD-10-CM | POA: Diagnosis not present

## 2013-01-27 DIAGNOSIS — IMO0001 Reserved for inherently not codable concepts without codable children: Secondary | ICD-10-CM | POA: Diagnosis not present

## 2013-01-27 DIAGNOSIS — Z96619 Presence of unspecified artificial shoulder joint: Secondary | ICD-10-CM | POA: Diagnosis not present

## 2013-01-27 DIAGNOSIS — Z471 Aftercare following joint replacement surgery: Secondary | ICD-10-CM | POA: Diagnosis not present

## 2013-01-27 DIAGNOSIS — Z602 Problems related to living alone: Secondary | ICD-10-CM | POA: Diagnosis not present

## 2013-01-28 DIAGNOSIS — Z96619 Presence of unspecified artificial shoulder joint: Secondary | ICD-10-CM | POA: Diagnosis not present

## 2013-01-28 DIAGNOSIS — Z471 Aftercare following joint replacement surgery: Secondary | ICD-10-CM | POA: Diagnosis not present

## 2013-01-28 DIAGNOSIS — IMO0001 Reserved for inherently not codable concepts without codable children: Secondary | ICD-10-CM | POA: Diagnosis not present

## 2013-01-28 DIAGNOSIS — Z602 Problems related to living alone: Secondary | ICD-10-CM | POA: Diagnosis not present

## 2013-01-28 DIAGNOSIS — Z7901 Long term (current) use of anticoagulants: Secondary | ICD-10-CM | POA: Diagnosis not present

## 2013-01-29 DIAGNOSIS — Z471 Aftercare following joint replacement surgery: Secondary | ICD-10-CM | POA: Diagnosis not present

## 2013-01-29 DIAGNOSIS — Z7901 Long term (current) use of anticoagulants: Secondary | ICD-10-CM | POA: Diagnosis not present

## 2013-01-29 DIAGNOSIS — Z602 Problems related to living alone: Secondary | ICD-10-CM | POA: Diagnosis not present

## 2013-01-29 DIAGNOSIS — IMO0001 Reserved for inherently not codable concepts without codable children: Secondary | ICD-10-CM | POA: Diagnosis not present

## 2013-01-29 DIAGNOSIS — Z96619 Presence of unspecified artificial shoulder joint: Secondary | ICD-10-CM | POA: Diagnosis not present

## 2013-02-01 DIAGNOSIS — Z7901 Long term (current) use of anticoagulants: Secondary | ICD-10-CM | POA: Diagnosis not present

## 2013-02-01 DIAGNOSIS — Z471 Aftercare following joint replacement surgery: Secondary | ICD-10-CM | POA: Diagnosis not present

## 2013-02-01 DIAGNOSIS — Z602 Problems related to living alone: Secondary | ICD-10-CM | POA: Diagnosis not present

## 2013-02-01 DIAGNOSIS — IMO0001 Reserved for inherently not codable concepts without codable children: Secondary | ICD-10-CM | POA: Diagnosis not present

## 2013-02-01 DIAGNOSIS — Z96619 Presence of unspecified artificial shoulder joint: Secondary | ICD-10-CM | POA: Diagnosis not present

## 2013-02-02 DIAGNOSIS — Z471 Aftercare following joint replacement surgery: Secondary | ICD-10-CM | POA: Diagnosis not present

## 2013-02-02 DIAGNOSIS — Z7901 Long term (current) use of anticoagulants: Secondary | ICD-10-CM | POA: Diagnosis not present

## 2013-02-02 DIAGNOSIS — Z602 Problems related to living alone: Secondary | ICD-10-CM | POA: Diagnosis not present

## 2013-02-02 DIAGNOSIS — IMO0001 Reserved for inherently not codable concepts without codable children: Secondary | ICD-10-CM | POA: Diagnosis not present

## 2013-02-02 DIAGNOSIS — Z96619 Presence of unspecified artificial shoulder joint: Secondary | ICD-10-CM | POA: Diagnosis not present

## 2013-02-03 DIAGNOSIS — Z7901 Long term (current) use of anticoagulants: Secondary | ICD-10-CM | POA: Diagnosis not present

## 2013-02-03 DIAGNOSIS — Z471 Aftercare following joint replacement surgery: Secondary | ICD-10-CM | POA: Diagnosis not present

## 2013-02-03 DIAGNOSIS — IMO0001 Reserved for inherently not codable concepts without codable children: Secondary | ICD-10-CM | POA: Diagnosis not present

## 2013-02-03 DIAGNOSIS — Z96619 Presence of unspecified artificial shoulder joint: Secondary | ICD-10-CM | POA: Diagnosis not present

## 2013-02-03 DIAGNOSIS — Z602 Problems related to living alone: Secondary | ICD-10-CM | POA: Diagnosis not present

## 2013-02-04 DIAGNOSIS — Z471 Aftercare following joint replacement surgery: Secondary | ICD-10-CM | POA: Diagnosis not present

## 2013-02-04 DIAGNOSIS — Z602 Problems related to living alone: Secondary | ICD-10-CM | POA: Diagnosis not present

## 2013-02-04 DIAGNOSIS — Z96619 Presence of unspecified artificial shoulder joint: Secondary | ICD-10-CM | POA: Diagnosis not present

## 2013-02-04 DIAGNOSIS — Z7901 Long term (current) use of anticoagulants: Secondary | ICD-10-CM | POA: Diagnosis not present

## 2013-02-04 DIAGNOSIS — IMO0001 Reserved for inherently not codable concepts without codable children: Secondary | ICD-10-CM | POA: Diagnosis not present

## 2013-02-05 DIAGNOSIS — Z602 Problems related to living alone: Secondary | ICD-10-CM | POA: Diagnosis not present

## 2013-02-05 DIAGNOSIS — IMO0001 Reserved for inherently not codable concepts without codable children: Secondary | ICD-10-CM | POA: Diagnosis not present

## 2013-02-05 DIAGNOSIS — Z96619 Presence of unspecified artificial shoulder joint: Secondary | ICD-10-CM | POA: Diagnosis not present

## 2013-02-05 DIAGNOSIS — Z471 Aftercare following joint replacement surgery: Secondary | ICD-10-CM | POA: Diagnosis not present

## 2013-02-05 DIAGNOSIS — Z7901 Long term (current) use of anticoagulants: Secondary | ICD-10-CM | POA: Diagnosis not present

## 2013-02-08 DIAGNOSIS — Z96619 Presence of unspecified artificial shoulder joint: Secondary | ICD-10-CM | POA: Diagnosis not present

## 2013-02-08 DIAGNOSIS — IMO0001 Reserved for inherently not codable concepts without codable children: Secondary | ICD-10-CM | POA: Diagnosis not present

## 2013-02-08 DIAGNOSIS — Z602 Problems related to living alone: Secondary | ICD-10-CM | POA: Diagnosis not present

## 2013-02-08 DIAGNOSIS — Z471 Aftercare following joint replacement surgery: Secondary | ICD-10-CM | POA: Diagnosis not present

## 2013-02-08 DIAGNOSIS — Z7901 Long term (current) use of anticoagulants: Secondary | ICD-10-CM | POA: Diagnosis not present

## 2013-02-12 DIAGNOSIS — Z96619 Presence of unspecified artificial shoulder joint: Secondary | ICD-10-CM | POA: Diagnosis not present

## 2013-02-12 DIAGNOSIS — IMO0001 Reserved for inherently not codable concepts without codable children: Secondary | ICD-10-CM | POA: Diagnosis not present

## 2013-02-12 DIAGNOSIS — Z602 Problems related to living alone: Secondary | ICD-10-CM | POA: Diagnosis not present

## 2013-02-12 DIAGNOSIS — Z7901 Long term (current) use of anticoagulants: Secondary | ICD-10-CM | POA: Diagnosis not present

## 2013-02-12 DIAGNOSIS — Z471 Aftercare following joint replacement surgery: Secondary | ICD-10-CM | POA: Diagnosis not present

## 2013-04-21 DIAGNOSIS — M19019 Primary osteoarthritis, unspecified shoulder: Secondary | ICD-10-CM | POA: Diagnosis not present

## 2013-04-21 DIAGNOSIS — Z96619 Presence of unspecified artificial shoulder joint: Secondary | ICD-10-CM | POA: Diagnosis not present

## 2013-04-26 DIAGNOSIS — I4891 Unspecified atrial fibrillation: Secondary | ICD-10-CM | POA: Diagnosis not present

## 2013-04-26 DIAGNOSIS — K219 Gastro-esophageal reflux disease without esophagitis: Secondary | ICD-10-CM | POA: Diagnosis not present

## 2013-04-26 DIAGNOSIS — I10 Essential (primary) hypertension: Secondary | ICD-10-CM | POA: Diagnosis not present

## 2013-04-26 DIAGNOSIS — E039 Hypothyroidism, unspecified: Secondary | ICD-10-CM | POA: Diagnosis not present

## 2013-05-04 DIAGNOSIS — M25519 Pain in unspecified shoulder: Secondary | ICD-10-CM | POA: Diagnosis not present

## 2013-05-07 DIAGNOSIS — M25519 Pain in unspecified shoulder: Secondary | ICD-10-CM | POA: Diagnosis not present

## 2013-05-10 DIAGNOSIS — R928 Other abnormal and inconclusive findings on diagnostic imaging of breast: Secondary | ICD-10-CM | POA: Diagnosis not present

## 2013-05-11 DIAGNOSIS — M25519 Pain in unspecified shoulder: Secondary | ICD-10-CM | POA: Diagnosis not present

## 2013-05-13 DIAGNOSIS — M25519 Pain in unspecified shoulder: Secondary | ICD-10-CM | POA: Diagnosis not present

## 2013-05-17 DIAGNOSIS — M25519 Pain in unspecified shoulder: Secondary | ICD-10-CM | POA: Diagnosis not present

## 2013-05-19 DIAGNOSIS — M25519 Pain in unspecified shoulder: Secondary | ICD-10-CM | POA: Diagnosis not present

## 2013-06-18 DIAGNOSIS — IMO0002 Reserved for concepts with insufficient information to code with codable children: Secondary | ICD-10-CM | POA: Diagnosis not present

## 2013-06-18 DIAGNOSIS — M545 Low back pain, unspecified: Secondary | ICD-10-CM | POA: Diagnosis not present

## 2013-06-28 DIAGNOSIS — M5137 Other intervertebral disc degeneration, lumbosacral region: Secondary | ICD-10-CM | POA: Diagnosis not present

## 2013-07-01 DIAGNOSIS — M5137 Other intervertebral disc degeneration, lumbosacral region: Secondary | ICD-10-CM | POA: Diagnosis not present

## 2013-07-05 DIAGNOSIS — E042 Nontoxic multinodular goiter: Secondary | ICD-10-CM | POA: Diagnosis not present

## 2013-07-05 DIAGNOSIS — E559 Vitamin D deficiency, unspecified: Secondary | ICD-10-CM | POA: Diagnosis not present

## 2013-07-05 DIAGNOSIS — I10 Essential (primary) hypertension: Secondary | ICD-10-CM | POA: Diagnosis not present

## 2013-07-05 DIAGNOSIS — G2581 Restless legs syndrome: Secondary | ICD-10-CM | POA: Diagnosis not present

## 2013-07-05 DIAGNOSIS — Z6827 Body mass index (BMI) 27.0-27.9, adult: Secondary | ICD-10-CM | POA: Diagnosis not present

## 2013-07-05 DIAGNOSIS — I4891 Unspecified atrial fibrillation: Secondary | ICD-10-CM | POA: Diagnosis not present

## 2013-07-05 DIAGNOSIS — M810 Age-related osteoporosis without current pathological fracture: Secondary | ICD-10-CM | POA: Diagnosis not present

## 2013-07-13 DIAGNOSIS — M5137 Other intervertebral disc degeneration, lumbosacral region: Secondary | ICD-10-CM | POA: Diagnosis not present

## 2013-07-15 DIAGNOSIS — M5137 Other intervertebral disc degeneration, lumbosacral region: Secondary | ICD-10-CM | POA: Diagnosis not present

## 2013-07-16 DIAGNOSIS — Z96619 Presence of unspecified artificial shoulder joint: Secondary | ICD-10-CM | POA: Diagnosis not present

## 2013-07-22 DIAGNOSIS — Z9660 Presence of unspecified orthopedic joint implant: Secondary | ICD-10-CM | POA: Diagnosis not present

## 2013-07-22 DIAGNOSIS — M545 Low back pain, unspecified: Secondary | ICD-10-CM | POA: Diagnosis not present

## 2013-08-12 DIAGNOSIS — H34239 Retinal artery branch occlusion, unspecified eye: Secondary | ICD-10-CM | POA: Diagnosis not present

## 2013-08-12 DIAGNOSIS — H524 Presbyopia: Secondary | ICD-10-CM | POA: Diagnosis not present

## 2013-08-12 DIAGNOSIS — H40019 Open angle with borderline findings, low risk, unspecified eye: Secondary | ICD-10-CM | POA: Diagnosis not present

## 2013-08-12 DIAGNOSIS — H353190 Nonexudative age-related macular degeneration, unspecified eye, stage unspecified: Secondary | ICD-10-CM | POA: Diagnosis not present

## 2013-08-12 DIAGNOSIS — H35369 Drusen (degenerative) of macula, unspecified eye: Secondary | ICD-10-CM | POA: Diagnosis not present

## 2013-08-18 ENCOUNTER — Other Ambulatory Visit: Payer: Self-pay | Admitting: Dermatology

## 2013-08-18 DIAGNOSIS — D2330 Other benign neoplasm of skin of unspecified part of face: Secondary | ICD-10-CM | POA: Diagnosis not present

## 2013-08-18 DIAGNOSIS — L570 Actinic keratosis: Secondary | ICD-10-CM | POA: Diagnosis not present

## 2013-08-18 DIAGNOSIS — L723 Sebaceous cyst: Secondary | ICD-10-CM | POA: Diagnosis not present

## 2013-08-18 DIAGNOSIS — D485 Neoplasm of uncertain behavior of skin: Secondary | ICD-10-CM | POA: Diagnosis not present

## 2013-08-18 DIAGNOSIS — D1801 Hemangioma of skin and subcutaneous tissue: Secondary | ICD-10-CM | POA: Diagnosis not present

## 2013-08-18 DIAGNOSIS — L821 Other seborrheic keratosis: Secondary | ICD-10-CM | POA: Diagnosis not present

## 2013-09-01 DIAGNOSIS — J209 Acute bronchitis, unspecified: Secondary | ICD-10-CM | POA: Diagnosis not present

## 2013-09-06 DIAGNOSIS — H353190 Nonexudative age-related macular degeneration, unspecified eye, stage unspecified: Secondary | ICD-10-CM | POA: Diagnosis not present

## 2013-09-06 DIAGNOSIS — H43819 Vitreous degeneration, unspecified eye: Secondary | ICD-10-CM | POA: Diagnosis not present

## 2013-09-06 DIAGNOSIS — H348392 Tributary (branch) retinal vein occlusion, unspecified eye, stable: Secondary | ICD-10-CM | POA: Diagnosis not present

## 2013-10-26 DIAGNOSIS — Z23 Encounter for immunization: Secondary | ICD-10-CM | POA: Diagnosis not present

## 2013-10-26 DIAGNOSIS — Z1331 Encounter for screening for depression: Secondary | ICD-10-CM | POA: Diagnosis not present

## 2013-10-26 DIAGNOSIS — I10 Essential (primary) hypertension: Secondary | ICD-10-CM | POA: Diagnosis not present

## 2013-10-29 ENCOUNTER — Encounter: Payer: Self-pay | Admitting: *Deleted

## 2013-11-18 DIAGNOSIS — R42 Dizziness and giddiness: Secondary | ICD-10-CM | POA: Diagnosis not present

## 2013-11-18 DIAGNOSIS — R0609 Other forms of dyspnea: Secondary | ICD-10-CM | POA: Diagnosis not present

## 2013-11-18 DIAGNOSIS — R5383 Other fatigue: Secondary | ICD-10-CM | POA: Diagnosis not present

## 2013-11-18 DIAGNOSIS — I071 Rheumatic tricuspid insufficiency: Secondary | ICD-10-CM | POA: Diagnosis not present

## 2013-11-18 DIAGNOSIS — F341 Dysthymic disorder: Secondary | ICD-10-CM | POA: Diagnosis not present

## 2013-11-19 ENCOUNTER — Other Ambulatory Visit: Payer: Self-pay | Admitting: *Deleted

## 2013-11-19 ENCOUNTER — Encounter: Payer: Self-pay | Admitting: *Deleted

## 2013-11-19 ENCOUNTER — Ambulatory Visit (INDEPENDENT_AMBULATORY_CARE_PROVIDER_SITE_OTHER): Payer: Medicare Other | Admitting: Cardiology

## 2013-11-19 VITALS — BP 122/62 | HR 68 | Ht 62.0 in | Wt 161.0 lb

## 2013-11-19 DIAGNOSIS — R011 Cardiac murmur, unspecified: Secondary | ICD-10-CM | POA: Insufficient documentation

## 2013-11-19 DIAGNOSIS — R5382 Chronic fatigue, unspecified: Secondary | ICD-10-CM

## 2013-11-19 DIAGNOSIS — I482 Chronic atrial fibrillation, unspecified: Secondary | ICD-10-CM

## 2013-11-19 DIAGNOSIS — R0600 Dyspnea, unspecified: Secondary | ICD-10-CM | POA: Insufficient documentation

## 2013-11-19 DIAGNOSIS — I10 Essential (primary) hypertension: Secondary | ICD-10-CM | POA: Diagnosis not present

## 2013-11-19 DIAGNOSIS — R0602 Shortness of breath: Secondary | ICD-10-CM | POA: Diagnosis not present

## 2013-11-19 DIAGNOSIS — R0609 Other forms of dyspnea: Secondary | ICD-10-CM

## 2013-11-19 DIAGNOSIS — E039 Hypothyroidism, unspecified: Secondary | ICD-10-CM | POA: Insufficient documentation

## 2013-11-19 DIAGNOSIS — R5383 Other fatigue: Secondary | ICD-10-CM | POA: Insufficient documentation

## 2013-11-19 DIAGNOSIS — I4891 Unspecified atrial fibrillation: Secondary | ICD-10-CM | POA: Insufficient documentation

## 2013-11-19 MED ORDER — METOPROLOL TARTRATE 25 MG PO TABS: 25.0000 mg | ORAL_TABLET | Freq: Two times a day (BID) | ORAL | Status: DC

## 2013-11-19 NOTE — Progress Notes (Signed)
Patient ID: MAKINZI PRIEUR, female   DOB: Jul 17, 1923, 80 y.o.   MRN: [id number]    Patient Name: [name] Date of Encounter: 11/19/2013  Primary Care Provider:  Irven Shelling, MD Primary Cardiologist: Dorothy Spark  Problem List   Past Medical History  Diagnosis Date  . GERD (gastroesophageal reflux disease)   . Osteoporosis     fosamax 10 yrs, reclast 3 yrs  . Hypothyroidism   . Scoliosis   . Varicose veins   . Hypertension   . Hx of blood clots 60's    eye rt  . Allergic cough   . Heart murmur   . Peripheral vascular disease     blood clot behind rt eye   . Arthritis   . Thyroid nodule   . History of PSVT (paroxysmal supraventricular tachycardia)   . Primary hyperparathyroidism 2012  . Chronic atrial fibrillation 12/13    normal EF, severe tricuspid reguritation  . PVC's (premature ventricular contractions)     history of  . Facial neuralgia     atypical  . Hyperplastic colon polyp   . Macular degeneration of left eye    Past Surgical History  Procedure Laterality Date  . Total hip arthroplasty  1981,1982,2001,2007,    Right and Left  . Reverse shoulder arthroplasty Right 11/201/2014    Dr Onnie Graham  . Reverse shoulder arthroplasty Right 01/07/2013    Procedure: RIGHT REVERSE SHOULDER ARTHROPLASTY;  Surgeon: Marin Shutter, MD;  Location: [facility];  Service: Orthopedics;  Laterality: Right;    Allergies  Allergies  Allergen Reactions  . Amlodipine Besylate Swelling  . Lodine [Etodolac] Hives  . Naprosyn [Naproxen] Hives  . Penicillins Hives    REACTION: hives  . Pneumovax [Pneumococcal Polysaccharide Vaccine]     Whelps   . Robitussin Dm [Dextromethorphan-Guaifenesin] Hives  . Sulfonamide Derivatives Hives    REACTION: hives    HPI  A very pleasant 80 year old female with h/o chronic persistent a-fib on metoprolol and xarelto who is coming with concern of murmur, fatigue and SOB> The patient still lived independently in a condo but feels  down about slowing down and not being able to do what she used to do in the past. She is experiencing progressive DOE. No chest pain. She feels tired most of the time and also experiences dizziness. She has had a stress test in the past - last in 2007 prior to hip replacement and it was normal. She denies palpitations, syncope. No LE edema, orthopnea, PND. Echo from 2007 showed normal LVEF and mild tricuspid regurgitation.  Home Medications  Prior to Admission medications   Medication Sig Start Date End Date Taking? Authorizing Provider  Ascorbic Acid (VITAMIN C) 1000 MG tablet Take 1,000 mg by mouth daily.    Yes Historical Provider, MD  calcium citrate-vitamin D (CITRACAL+D) 315-200 MG-UNIT per tablet Take 1 tablet by mouth daily.    Yes Historical Provider, MD  Cyanocobalamin (B-12) 1000 MCG TBCR Take 1 tablet by mouth daily.    Yes Historical Provider, MD  ergocalciferol (VITAMIN D2) 50000 UNITS capsule Take 50,000 Units by mouth every 30 (thirty) days.    Yes Historical Provider, MD  hydrochlorothiazide (HYDRODIURIL) 25 MG tablet Take 25 mg by mouth daily.   Yes Historical Provider, MD  levothyroxine (SYNTHROID, LEVOTHROID) 100 MCG tablet Take 100 mcg by mouth daily.    Yes Historical Provider, MD  metoprolol (LOPRESSOR) 50 MG tablet Take 1 tablet by mouth 2 (two) times daily.  08/28/13  Yes Historical Provider, MD  Multiple Vitamin (MULTIVITAMIN) tablet Take 1 tablet by mouth daily.     Yes Historical Provider, MD  NON FORMULARY Take 1 tablet by mouth 2 (two) times daily. Macular Degeneration Supp. Take 1 tab by mouth twice daily.   Yes Historical Provider, MD  omeprazole (PRILOSEC) 20 MG capsule Take 20 mg by mouth every other day.    Yes Historical Provider, MD  XARELTO 15 MG TABS tablet Take 1 tablet by mouth daily. 04/11/12  Yes Historical Provider, MD    Family History  Family History  Problem Relation Age of Onset  . Breast cancer Daughter   . Heart attack Brother   . Stroke Mother    . Rheum arthritis Father   . Lung cancer Father   . Lung cancer Sister     Social History  History   Social History  . Marital Status: Widowed    Spouse Name: N/A    Number of Children: 3  . Years of Education: N/A   Occupational History  . 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 Smoking status: Never Smoker   . Smokeless tobacco: Never Used  . Alcohol Use: No  . Drug Use: No  . Sexual Activity: Not on file   Other Topics Concern  . Not on file   Social History Narrative   Husband deceased of colon cancer.      Review of Systems, as per HPI, otherwise negative General:  No chills, fever, night sweats or weight changes.  Cardiovascular:  No chest pain, dyspnea on exertion, edema, orthopnea, palpitations, paroxysmal nocturnal dyspnea. Dermatological: No rash, lesions/masses Respiratory: No cough, dyspnea Urologic: No hematuria, dysuria Abdominal:   No nausea, vomiting, diarrhea, bright red blood per rectum, melena, or hematemesis Neurologic:  No visual changes, wkns, changes in mental status. All other systems reviewed and are otherwise negative except as noted above.  Physical Exam  Blood pressure 122/62, pulse 68, height 5\' 2"  (1.575 m), weight 161 lb (73.029 kg).  General: Pleasant, NAD Psych: Normal affect. Neuro: Alert and oriented X 3. Moves all extremities spontaneously. HEENT: Normal  Neck: Supple without bruits or JVD. Lungs:  Resp regular and unlabored, CTA. Heart: RRR no s3, s4, 2/6 systolic murmur at the LLSB. Abdomen: Soft, non-tender, non-distended, BS + x 4.  Extremities: No clubbing, cyanosis or edema. DP/PT/Radials 2+ and equal bilaterally.  Labs:  No results found for this basename: CKTOTAL, CKMB, TROPONINI,  in the last 72 hours Lab Results  Component Value Date   WBC 7.4 01/01/2013   HGB 13.3 01/01/2013   HCT 39.0 01/01/2013   MCV 95.4 01/01/2013   PLT 257 01/01/2013    No results found for this  basename: DDIMER   No components found with this basename: POCBNP,     Component Value Date/Time   NA 139 01/01/2013 1037   K 3.6 01/01/2013 1037   CL 100 01/01/2013 1037   CO2 29 01/01/2013 1037   GLUCOSE 117* 01/01/2013 1037   BUN 23 01/01/2013 1037   CREATININE 1.18* 01/01/2013 1037   CALCIUM 10.4 01/01/2013 1037   GFRNONAA 40* 01/01/2013 1037   GFRAA 47* 01/01/2013 1037   No results found for this basename: CHOL, HDL, LDLCALC, TRIG    Accessory Clinical Findings  Echocardiogram - 2007 LEFT VENTRICLE: - Left ventricular size was normal. - Overall left ventricular systolic function was normal. - Left ventricular ejection fraction was  estimated to be 65 %. - There was no diagnostic evidence of left ventricular regional wall motion abnormalities. - Left ventricular wall thickness was normal. AORTIC VALVE: - The aortic valve was grossly normal. - There was normal aortic valve leaflet excursion. Doppler interpretation(s): - There was trivial aortic valvular regurgitation. AORTA: - The aortic root was normal in size. MITRAL VALVE: - The mitral valve was grossly normal. Doppler interpretation(s): - There was no significant mitral valvular regurgitation. LEFT ATRIUM: - Left atrial size was normal. RIGHT VENTRICLE: - Right ventricular size was normal. PULMONIC VALVE: - The pulmonic valve was not well visualized. Doppler interpretation(s): - The transpulmonic velocity was within the normal range. - There was trivial pulmonic regurgitation. TRICUSPID VALVE: - The tricuspid valve was grossly normal. - Tricuspid leaflet excursion was normal. Doppler interpretation(s): - There was mild tricuspid valvular regurgitation. PULMONARY ARTERY: - The pulmonary artery was not well visualized. RIGHT ATRIUM: - The right atrium was grossly normal. - The right atrium was not well visualized. SYSTEMIC VEINS: - The inferior vena cava was mildly dilated. PERICARDIUM: - There was no  pericardial effusion. --------------------------------------------------------------- SUMMARY - Overall left ventricular systolic function was normal. Left ventricular ejection fraction was estimated to be 65 %. There was no diagnostic evidence of left ventricular regional wall motion abnormalities. - There was trivial aortic valvular regurgitation. - The inferior vena cava was mildly dilated.  ECG - a-fib , 68 BPM, non-specific T wave abnormalities    Assessment & Plan  80 year old female   1. Progressive DOE, fatigue - order nuclear Lexiscan stress test. I will also decrease Metoprolol to 25 mg po bid, if HR too high, I will resume current dose. She has h/o hypothyroidism followed by PCP.   2. Murmur - check echocardiogram  3. Hypertension - controlled and might be too low for age, decrease Metoprolol to 25 mg po bid  4. A-fib - chronic persistent, on Xarelto, tolerating it well and rate controlled on Metoprolol  Follow up in 1 month.   Dorothy Spark, MD, [facility] 11/19/2013, 10:52 AM

## 2013-11-19 NOTE — Patient Instructions (Signed)
Your physician has recommended you make the following change in your medication:    DECREASE YOUR METOPROLOL TO 25 MG TWICE DAILY   Your physician has requested that you have an echocardiogram. Echocardiography is a painless test that uses sound waves to create images of your heart. It provides your doctor with information about the size and shape of your heart and how well your heart's chambers and valves are working. This procedure takes approximately one hour. There are no restrictions for this procedure.   Your physician has requested that you have a lexiscan myoview. For further information please visit [email or web address]. Please follow instruction sheet, as given.   Your physician recommends that you schedule a follow-up appointment in: [place] TEST ARE COMPLETED.

## 2013-11-25 ENCOUNTER — Telehealth: Payer: Self-pay | Admitting: Cardiology

## 2013-11-25 DIAGNOSIS — R0600 Dyspnea, unspecified: Secondary | ICD-10-CM | POA: Diagnosis not present

## 2013-11-25 DIAGNOSIS — S76311A Strain of muscle, fascia and tendon of the posterior muscle group at thigh level, right thigh, initial encounter: Secondary | ICD-10-CM | POA: Diagnosis not present

## 2013-11-25 DIAGNOSIS — S8011XA Contusion of right lower leg, initial encounter: Secondary | ICD-10-CM | POA: Diagnosis not present

## 2013-11-25 DIAGNOSIS — N183 Chronic kidney disease, stage 3 (moderate): Secondary | ICD-10-CM | POA: Diagnosis not present

## 2013-11-25 NOTE — Telephone Encounter (Signed)
Will route this message to Dr Meda Coffee for further review and recommendation on cancellation of appts.

## 2013-11-25 NOTE — Telephone Encounter (Signed)
Patient was schedule for Echo and Myoview on 12-01-13.   Per Janett Billow from [facility] [place] doctor didn't think pt need to have tests done.

## 2013-11-26 NOTE — Telephone Encounter (Signed)
She lives independently and despite age looks great. Please call her and ask if she wants to have it done, I would prefer that. At least the echo. Thank you, K

## 2013-11-26 NOTE — Telephone Encounter (Signed)
Follow up    Returning call back to speak with nurse

## 2013-11-26 NOTE — Telephone Encounter (Signed)
Spoke with Janett Billow at Dr Delene Ruffini office and Janett Billow stated per Dr Laurann Montana, he had no idea that the pt had already seen Dr Delora Fuel wants to proceed with the needed nuclear stress test and echo as ordered by Dr Meda Coffee. Will inform our scheduling dept to contact pt to have these tests rescheduled.

## 2013-11-26 NOTE — Telephone Encounter (Signed)
Contacted pts referring Doctors office [facility] [place] to ask why pts echo and nuclear stress test was cancelled.  Spoke with nurse Janett Billow there and she states that the pt came into their office yesterday for an office visit and the doctor there cancelled both test.  Nurse Janett Billow states she will ask her doctor why the 2 test were cancelled and follow-up thereafter with our office.   Tried contacting the pt to ask why tests were cancelled and no answer or return call back.   Will route this message to Dr Meda Coffee for further review.

## 2013-11-29 ENCOUNTER — Telehealth: Payer: Self-pay | Admitting: Cardiology

## 2013-11-29 NOTE — Telephone Encounter (Signed)
Patient is returning your call. Please call back.  °

## 2013-11-29 NOTE — Telephone Encounter (Signed)
Pt calling to let Dr Meda Coffee know that she still wants to proceed with getting her echo and stress test done, but she cannot proceed with these scheduled test for a about month or so.  Pt state she was recently cleaning at her house and bent down to clean something under her table, and then felt something pull in her hamstring.  Pt states she went to her PCP and he stated the pt tore her hamstring, and it would take about a month of recovery. Pt states she just can't commit to driving all the way to our office to do her test, because of the soreness and pain.  Informed the pt that her echo and stress will be sitting and lying, with no treadmill involved.  Informed the pt that I will update Dr Meda Coffee about this, and we will get her scheduled for her echo and stress when she is recovered.  Scheduling dept aware of pts request.  Pt verbalized understanding and agrees with this plan.

## 2013-11-29 NOTE — Telephone Encounter (Signed)
Left a voice mail on Friday for [name] to call and reschedule Echo/Stress Tests.   Patient called to let Dr. Meda Coffee know that she is unable to do the tests right now because she is unable to get around due to a hamstring tear.   She will call back when she is heal.   Will defer tests for 2 months.

## 2013-11-29 NOTE — Telephone Encounter (Signed)
Pt calling to let Dr Meda Coffee know that she still wants to proceed with getting her echo and stress test done, but she cannot proceed with these scheduled test for a about month or so.  Pt state she was recently cleaning at her house and bent down to clean something under her table, and then felt something pull in her hamstring.  Pt states she went to her PCP and he stated the pt tore her hamstring, and it would take about a month of recovery.  Pt states she just can't commit to driving all the way to our office to do her test, because of the soreness and pain.  Informed the pt that her echo and stress will be sitting and lying, with no treadmill involved.  Informed the pt that I will update Dr Meda Coffee about this, and we will get her scheduled for her echo and stress when she is recovered.  Scheduling dept aware of pts request.  Pt verbalized understanding and agrees with this plan.

## 2013-12-01 ENCOUNTER — Encounter (HOSPITAL_COMMUNITY): Payer: Medicare Other

## 2013-12-01 ENCOUNTER — Other Ambulatory Visit (HOSPITAL_COMMUNITY): Payer: Medicare Other

## 2013-12-03 DIAGNOSIS — I509 Heart failure, unspecified: Secondary | ICD-10-CM | POA: Diagnosis not present

## 2013-12-03 DIAGNOSIS — S76311A Strain of muscle, fascia and tendon of the posterior muscle group at thigh level, right thigh, initial encounter: Secondary | ICD-10-CM | POA: Diagnosis not present

## 2013-12-03 DIAGNOSIS — I129 Hypertensive chronic kidney disease with stage 1 through stage 4 chronic kidney disease, or unspecified chronic kidney disease: Secondary | ICD-10-CM | POA: Diagnosis not present

## 2013-12-09 ENCOUNTER — Other Ambulatory Visit: Payer: Self-pay | Admitting: Nurse Practitioner

## 2013-12-09 ENCOUNTER — Ambulatory Visit
Admission: RE | Admit: 2013-12-09 | Discharge: 2013-12-09 | Disposition: A | Discharge status: Home / Self Care | Payer: Medicare Other | Source: Ambulatory Visit | Attending: Nurse Practitioner | Admitting: Nurse Practitioner

## 2013-12-09 DIAGNOSIS — J9811 Atelectasis: Secondary | ICD-10-CM | POA: Diagnosis not present

## 2013-12-09 DIAGNOSIS — J209 Acute bronchitis, unspecified: Secondary | ICD-10-CM | POA: Diagnosis not present

## 2013-12-09 DIAGNOSIS — R239 Unspecified skin changes: Secondary | ICD-10-CM | POA: Diagnosis not present

## 2013-12-09 DIAGNOSIS — J9809 Other diseases of bronchus, not elsewhere classified: Secondary | ICD-10-CM | POA: Diagnosis not present

## 2013-12-09 DIAGNOSIS — R062 Wheezing: Secondary | ICD-10-CM | POA: Diagnosis not present

## 2013-12-09 DIAGNOSIS — R05 Cough: Secondary | ICD-10-CM | POA: Diagnosis not present

## 2013-12-09 DIAGNOSIS — Z79899 Other long term (current) drug therapy: Secondary | ICD-10-CM | POA: Diagnosis not present

## 2013-12-14 ENCOUNTER — Ambulatory Visit: Payer: Medicare Other | Admitting: Cardiology

## 2013-12-16 DIAGNOSIS — J209 Acute bronchitis, unspecified: Secondary | ICD-10-CM | POA: Diagnosis not present

## 2013-12-16 DIAGNOSIS — R239 Unspecified skin changes: Secondary | ICD-10-CM | POA: Diagnosis not present

## 2013-12-28 DIAGNOSIS — I129 Hypertensive chronic kidney disease with stage 1 through stage 4 chronic kidney disease, or unspecified chronic kidney disease: Secondary | ICD-10-CM | POA: Diagnosis not present

## 2013-12-28 DIAGNOSIS — R609 Edema, unspecified: Secondary | ICD-10-CM | POA: Diagnosis not present

## 2014-01-03 ENCOUNTER — Telehealth: Payer: Self-pay | Admitting: Cardiology

## 2014-01-03 DIAGNOSIS — E785 Hyperlipidemia, unspecified: Secondary | ICD-10-CM | POA: Diagnosis not present

## 2014-01-03 DIAGNOSIS — D126 Benign neoplasm of colon, unspecified: Secondary | ICD-10-CM | POA: Diagnosis not present

## 2014-01-03 DIAGNOSIS — Z1389 Encounter for screening for other disorder: Secondary | ICD-10-CM | POA: Diagnosis not present

## 2014-01-03 DIAGNOSIS — M859 Disorder of bone density and structure, unspecified: Secondary | ICD-10-CM | POA: Diagnosis not present

## 2014-01-03 DIAGNOSIS — E042 Nontoxic multinodular goiter: Secondary | ICD-10-CM | POA: Diagnosis not present

## 2014-01-03 DIAGNOSIS — E559 Vitamin D deficiency, unspecified: Secondary | ICD-10-CM | POA: Diagnosis not present

## 2014-01-03 DIAGNOSIS — I10 Essential (primary) hypertension: Secondary | ICD-10-CM | POA: Diagnosis not present

## 2014-01-03 DIAGNOSIS — I480 Paroxysmal atrial fibrillation: Secondary | ICD-10-CM | POA: Diagnosis not present

## 2014-01-03 NOTE — Telephone Encounter (Signed)
Call received from Dr. Forde Dandy today. He called to report he was seeing the patient today. He was reviewing Dr. Francesca Oman note from her office visit. The patient was to have an echo and myoview done. He was reporting that the echo was scheduled and not the myoview. In reviewing the patient's appointment history, I advised him the myoview was scheduled for 11/25/13, but there is a cancellation note that this was done- Patient (per Janett Billow from [facility]- MD didn't think patient needed test done). Per Dr. Forde Dandy, the patient didn't know about this and that we had forget to schedule the test. I advised I would forward to Dr. Meda Coffee to review and we will be in touch with the patient after that. The initial appointment was scheduled on 11/19/13 the day the patient was seen by Dr. Meda Coffee.

## 2014-01-04 NOTE — Telephone Encounter (Signed)
Patient's office cancelled the tests, we insisted on having it done, she wanted to postpone for a month. I am still positive about having both echo and Lexiscan nuclear stress test done whenever she is ready.

## 2014-01-05 ENCOUNTER — Ambulatory Visit (HOSPITAL_COMMUNITY): Payer: Medicare Other | Attending: Cardiovascular Disease | Admitting: Radiology

## 2014-01-05 DIAGNOSIS — R011 Cardiac murmur, unspecified: Secondary | ICD-10-CM | POA: Diagnosis not present

## 2014-01-05 DIAGNOSIS — R0602 Shortness of breath: Secondary | ICD-10-CM | POA: Diagnosis not present

## 2014-01-05 NOTE — Telephone Encounter (Signed)
Informed the pt of her echo showing normal heart function and 2 leaky valves, but nothing needing treatment at this time, and Per Dr Meda Coffee we will continue to follow this.  Pt verbalized understanding. Pt verbalized she will follow-up with our office when she is ready to schedule her Lexiscan nuclear stress test.

## 2014-01-05 NOTE — Progress Notes (Signed)
,  Echocardiogram performed.  

## 2014-02-08 DIAGNOSIS — H40013 Open angle with borderline findings, low risk, bilateral: Secondary | ICD-10-CM | POA: Diagnosis not present

## 2014-05-05 DIAGNOSIS — Z96642 Presence of left artificial hip joint: Secondary | ICD-10-CM | POA: Diagnosis not present

## 2014-05-05 DIAGNOSIS — Z471 Aftercare following joint replacement surgery: Secondary | ICD-10-CM | POA: Diagnosis not present

## 2014-05-05 DIAGNOSIS — Z96641 Presence of right artificial hip joint: Secondary | ICD-10-CM | POA: Diagnosis not present

## 2014-05-16 DIAGNOSIS — K625 Hemorrhage of anus and rectum: Secondary | ICD-10-CM | POA: Diagnosis not present

## 2014-05-31 DIAGNOSIS — I129 Hypertensive chronic kidney disease with stage 1 through stage 4 chronic kidney disease, or unspecified chronic kidney disease: Secondary | ICD-10-CM | POA: Diagnosis not present

## 2014-05-31 DIAGNOSIS — I481 Persistent atrial fibrillation: Secondary | ICD-10-CM | POA: Diagnosis not present

## 2014-05-31 DIAGNOSIS — N289 Disorder of kidney and ureter, unspecified: Secondary | ICD-10-CM | POA: Diagnosis not present

## 2014-05-31 DIAGNOSIS — K219 Gastro-esophageal reflux disease without esophagitis: Secondary | ICD-10-CM | POA: Diagnosis not present

## 2014-05-31 DIAGNOSIS — Z1389 Encounter for screening for other disorder: Secondary | ICD-10-CM | POA: Diagnosis not present

## 2014-05-31 DIAGNOSIS — Z0000 Encounter for general adult medical examination without abnormal findings: Secondary | ICD-10-CM | POA: Diagnosis not present

## 2014-05-31 DIAGNOSIS — N183 Chronic kidney disease, stage 3 (moderate): Secondary | ICD-10-CM | POA: Diagnosis not present

## 2014-06-10 DIAGNOSIS — N289 Disorder of kidney and ureter, unspecified: Secondary | ICD-10-CM | POA: Diagnosis not present

## 2014-06-13 ENCOUNTER — Inpatient Hospital Stay (HOSPITAL_COMMUNITY)
Admission: EM | Admit: 2014-06-13 | Discharge: 2014-06-16 | DRG: 552 | Disposition: A | Discharge status: Home / Self Care | Payer: Medicare Other | Attending: Internal Medicine | Admitting: Internal Medicine

## 2014-06-13 ENCOUNTER — Encounter (HOSPITAL_COMMUNITY): Payer: Self-pay

## 2014-06-13 ENCOUNTER — Emergency Department (HOSPITAL_COMMUNITY): Payer: Medicare Other

## 2014-06-13 DIAGNOSIS — Z803 Family history of malignant neoplasm of breast: Secondary | ICD-10-CM

## 2014-06-13 DIAGNOSIS — K5900 Constipation, unspecified: Secondary | ICD-10-CM | POA: Diagnosis not present

## 2014-06-13 DIAGNOSIS — M810 Age-related osteoporosis without current pathological fracture: Secondary | ICD-10-CM | POA: Diagnosis present

## 2014-06-13 DIAGNOSIS — Z8249 Family history of ischemic heart disease and other diseases of the circulatory system: Secondary | ICD-10-CM

## 2014-06-13 DIAGNOSIS — M25552 Pain in left hip: Secondary | ICD-10-CM

## 2014-06-13 DIAGNOSIS — T84090A Other mechanical complication of internal right hip prosthesis, initial encounter: Secondary | ICD-10-CM | POA: Diagnosis not present

## 2014-06-13 DIAGNOSIS — Z880 Allergy status to penicillin: Secondary | ICD-10-CM | POA: Diagnosis not present

## 2014-06-13 DIAGNOSIS — I129 Hypertensive chronic kidney disease with stage 1 through stage 4 chronic kidney disease, or unspecified chronic kidney disease: Secondary | ICD-10-CM | POA: Diagnosis present

## 2014-06-13 DIAGNOSIS — M4806 Spinal stenosis, lumbar region: Secondary | ICD-10-CM | POA: Diagnosis not present

## 2014-06-13 DIAGNOSIS — Z86010 Personal history of colonic polyps: Secondary | ICD-10-CM | POA: Diagnosis not present

## 2014-06-13 DIAGNOSIS — M5410 Radiculopathy, site unspecified: Secondary | ICD-10-CM | POA: Diagnosis present

## 2014-06-13 DIAGNOSIS — K219 Gastro-esophageal reflux disease without esophagitis: Secondary | ICD-10-CM | POA: Diagnosis not present

## 2014-06-13 DIAGNOSIS — M25559 Pain in unspecified hip: Secondary | ICD-10-CM | POA: Diagnosis not present

## 2014-06-13 DIAGNOSIS — Z79891 Long term (current) use of opiate analgesic: Secondary | ICD-10-CM | POA: Diagnosis not present

## 2014-06-13 DIAGNOSIS — M21372 Foot drop, left foot: Secondary | ICD-10-CM | POA: Diagnosis present

## 2014-06-13 DIAGNOSIS — Z882 Allergy status to sulfonamides status: Secondary | ICD-10-CM | POA: Diagnosis not present

## 2014-06-13 DIAGNOSIS — Z96643 Presence of artificial hip joint, bilateral: Secondary | ICD-10-CM | POA: Diagnosis present

## 2014-06-13 DIAGNOSIS — H3530 Unspecified macular degeneration: Secondary | ICD-10-CM | POA: Diagnosis present

## 2014-06-13 DIAGNOSIS — M1990 Unspecified osteoarthritis, unspecified site: Secondary | ICD-10-CM | POA: Diagnosis present

## 2014-06-13 DIAGNOSIS — E210 Primary hyperparathyroidism: Secondary | ICD-10-CM | POA: Diagnosis present

## 2014-06-13 DIAGNOSIS — Z96611 Presence of right artificial shoulder joint: Secondary | ICD-10-CM | POA: Diagnosis present

## 2014-06-13 DIAGNOSIS — I482 Chronic atrial fibrillation: Secondary | ICD-10-CM | POA: Diagnosis present

## 2014-06-13 DIAGNOSIS — Z823 Family history of stroke: Secondary | ICD-10-CM | POA: Diagnosis not present

## 2014-06-13 DIAGNOSIS — I4891 Unspecified atrial fibrillation: Secondary | ICD-10-CM | POA: Diagnosis present

## 2014-06-13 DIAGNOSIS — Z79899 Other long term (current) drug therapy: Secondary | ICD-10-CM

## 2014-06-13 DIAGNOSIS — M419 Scoliosis, unspecified: Secondary | ICD-10-CM | POA: Diagnosis present

## 2014-06-13 DIAGNOSIS — I10 Essential (primary) hypertension: Secondary | ICD-10-CM | POA: Diagnosis not present

## 2014-06-13 DIAGNOSIS — E039 Hypothyroidism, unspecified: Secondary | ICD-10-CM | POA: Diagnosis present

## 2014-06-13 DIAGNOSIS — N183 Chronic kidney disease, stage 3 unspecified: Secondary | ICD-10-CM | POA: Diagnosis present

## 2014-06-13 DIAGNOSIS — I739 Peripheral vascular disease, unspecified: Secondary | ICD-10-CM | POA: Diagnosis present

## 2014-06-13 DIAGNOSIS — Z888 Allergy status to other drugs, medicaments and biological substances status: Secondary | ICD-10-CM

## 2014-06-13 DIAGNOSIS — M25551 Pain in right hip: Secondary | ICD-10-CM | POA: Diagnosis not present

## 2014-06-13 DIAGNOSIS — Z801 Family history of malignant neoplasm of trachea, bronchus and lung: Secondary | ICD-10-CM

## 2014-06-13 DIAGNOSIS — M2580 Other specified joint disorders, unspecified joint: Secondary | ICD-10-CM | POA: Diagnosis present

## 2014-06-13 DIAGNOSIS — Z7901 Long term (current) use of anticoagulants: Secondary | ICD-10-CM | POA: Diagnosis not present

## 2014-06-13 DIAGNOSIS — M5416 Radiculopathy, lumbar region: Secondary | ICD-10-CM

## 2014-06-13 HISTORY — DX: Unspecified chronic bronchitis: J42

## 2014-06-13 HISTORY — DX: Chronic kidney disease, stage 3 unspecified: N18.30

## 2014-06-13 HISTORY — DX: Chronic kidney disease, stage 3 (moderate): N18.3

## 2014-06-13 HISTORY — DX: Acute bronchitis, unspecified: J20.9

## 2014-06-13 LAB — CBC WITH DIFFERENTIAL/PLATELET
BASOS ABS: 0 10*3/uL (ref 0.0–0.1)
BASOS PCT: 0 % (ref 0–1)
Eosinophils Absolute: 0.1 10*3/uL (ref 0.0–0.7)
Eosinophils Relative: 2 % (ref 0–5)
HCT: 31.9 % — ABNORMAL LOW (ref 36.0–46.0)
Hemoglobin: 10.5 g/dL — ABNORMAL LOW (ref 12.0–15.0)
Lymphocytes Relative: 19 % (ref 12–46)
Lymphs Abs: 1.1 10*3/uL (ref 0.7–4.0)
MCH: 31.7 pg (ref 26.0–34.0)
MCHC: 32.9 g/dL (ref 30.0–36.0)
MCV: 96.4 fL (ref 78.0–100.0)
Monocytes Absolute: 0.4 10*3/uL (ref 0.1–1.0)
Monocytes Relative: 7 % (ref 3–12)
NEUTROS PCT: 72 % (ref 43–77)
Neutro Abs: 4.2 10*3/uL (ref 1.7–7.7)
Platelets: 198 10*3/uL (ref 150–400)
RBC: 3.31 MIL/uL — ABNORMAL LOW (ref 3.87–5.11)
RDW: 13.5 % (ref 11.5–15.5)
WBC: 5.7 10*3/uL (ref 4.0–10.5)

## 2014-06-13 LAB — PROTIME-INR
INR: 1.71 — AB (ref 0.00–1.49)
Prothrombin Time: 20.2 seconds — ABNORMAL HIGH (ref 11.6–15.2)

## 2014-06-13 LAB — BASIC METABOLIC PANEL
ANION GAP: 8 (ref 5–15)
BUN: 16 mg/dL (ref 6–23)
CALCIUM: 10 mg/dL (ref 8.4–10.5)
CHLORIDE: 108 mmol/L (ref 96–112)
CO2: 26 mmol/L (ref 19–32)
Creatinine, Ser: 1.19 mg/dL — ABNORMAL HIGH (ref 0.50–1.10)
GFR calc non Af Amer: 40 mL/min — ABNORMAL LOW (ref >90)
GFR, EST AFRICAN AMERICAN: 46 mL/min — AB (ref >90)
Glucose, Bld: 112 mg/dL — ABNORMAL HIGH (ref 70–99)
POTASSIUM: 4.2 mmol/L (ref 3.5–5.1)
Sodium: 142 mmol/L (ref 135–145)

## 2014-06-13 LAB — URINALYSIS, ROUTINE W REFLEX MICROSCOPIC
BILIRUBIN URINE: NEGATIVE
Glucose, UA: NEGATIVE mg/dL
HGB URINE DIPSTICK: NEGATIVE
KETONES UR: NEGATIVE mg/dL
Nitrite: NEGATIVE
PROTEIN: NEGATIVE mg/dL
SPECIFIC GRAVITY, URINE: 1.008 (ref 1.005–1.030)
Urobilinogen, UA: 0.2 mg/dL (ref 0.0–1.0)
pH: 6 (ref 5.0–8.0)

## 2014-06-13 LAB — URINE MICROSCOPIC-ADD ON

## 2014-06-13 LAB — I-STAT TROPONIN, ED: Troponin i, poc: 0 ng/mL (ref 0.00–0.08)

## 2014-06-13 MED ORDER — HYDROCODONE-ACETAMINOPHEN 5-325 MG PO TABS
1.0000 | ORAL_TABLET | Freq: Three times a day (TID) | ORAL | Status: DC | PRN
  Administered 2014-06-13: 2 via ORAL
  Administered 2014-06-14 (×2): 1 via ORAL
  Administered 2014-06-14: 2 via ORAL
  Administered 2014-06-15 (×3): 1 via ORAL
  Filled 2014-06-13 (×3): qty 2
  Filled 2014-06-13 (×5): qty 1

## 2014-06-13 MED ORDER — ONDANSETRON HCL 4 MG PO TABS: 4.0000 mg | ORAL_TABLET | Freq: Four times a day (QID) | ORAL | Status: DC | PRN

## 2014-06-13 MED ORDER — VITAMIN B-12 1000 MCG PO TABS
1000.0000 ug | ORAL_TABLET | Freq: Every day | ORAL | Status: DC
  Administered 2014-06-13 – 2014-06-16 (×4): 1000 ug via ORAL
  Filled 2014-06-13 (×3): qty 1

## 2014-06-13 MED ORDER — POLYETHYLENE GLYCOL 3350 17 G PO PACK
17.0000 g | PACK | Freq: Every day | ORAL | Status: DC
  Administered 2014-06-14 – 2014-06-16 (×3): 17 g via ORAL
  Filled 2014-06-13 (×4): qty 1

## 2014-06-13 MED ORDER — MORPHINE SULFATE 4 MG/ML IJ SOLN
4.0000 mg | Freq: Once | INTRAMUSCULAR | Status: AC
  Administered 2014-06-13: 4 mg via INTRAVENOUS
  Filled 2014-06-13: qty 1

## 2014-06-13 MED ORDER — BISACODYL 10 MG RE SUPP: 10.0000 mg | Freq: Every day | RECTAL | Status: DC | PRN

## 2014-06-13 MED ORDER — ALUM & MAG HYDROXIDE-SIMETH 200-200-20 MG/5ML PO SUSP: 30.0000 mL | Freq: Four times a day (QID) | ORAL | Status: DC | PRN

## 2014-06-13 MED ORDER — ONDANSETRON HCL 4 MG/2ML IJ SOLN
4.0000 mg | Freq: Once | INTRAMUSCULAR | Status: AC
  Administered 2014-06-13: 4 mg via INTRAVENOUS
  Filled 2014-06-13: qty 2

## 2014-06-13 MED ORDER — ONDANSETRON HCL 4 MG/2ML IJ SOLN: 4.0000 mg | Freq: Four times a day (QID) | INTRAMUSCULAR | Status: DC | PRN

## 2014-06-13 MED ORDER — ACETAMINOPHEN 650 MG RE SUPP: 650.0000 mg | Freq: Four times a day (QID) | RECTAL | Status: DC | PRN

## 2014-06-13 MED ORDER — SODIUM CHLORIDE 0.9 % IV SOLN
INTRAVENOUS | Status: DC
  Administered 2014-06-13: 12:00:00 via INTRAVENOUS

## 2014-06-13 MED ORDER — VITAMIN C 500 MG PO TABS
1000.0000 mg | ORAL_TABLET | Freq: Every day | ORAL | Status: DC
  Administered 2014-06-13 – 2014-06-16 (×4): 1000 mg via ORAL
  Filled 2014-06-13 (×3): qty 2

## 2014-06-13 MED ORDER — CARBOXYMETHYLCELLULOSE SODIUM 1 % OP SOLN: 1.0000 [drp] | Freq: Two times a day (BID) | OPHTHALMIC | Status: DC

## 2014-06-13 MED ORDER — RIVAROXABAN 15 MG PO TABS
15.0000 mg | ORAL_TABLET | Freq: Every day | ORAL | Status: DC
  Administered 2014-06-13 – 2014-06-16 (×4): 15 mg via ORAL
  Filled 2014-06-13 (×4): qty 1

## 2014-06-13 MED ORDER — METOPROLOL TARTRATE 25 MG PO TABS
25.0000 mg | ORAL_TABLET | Freq: Two times a day (BID) | ORAL | Status: DC
  Administered 2014-06-13 – 2014-06-16 (×6): 25 mg via ORAL
  Filled 2014-06-13 (×6): qty 1

## 2014-06-13 MED ORDER — MORPHINE SULFATE 2 MG/ML IJ SOLN
2.0000 mg | INTRAMUSCULAR | Status: DC | PRN
  Administered 2014-06-15 – 2014-06-16 (×2): 2 mg via INTRAVENOUS
  Filled 2014-06-13 (×2): qty 1

## 2014-06-13 MED ORDER — POLYVINYL ALCOHOL 1.4 % OP SOLN
1.0000 [drp] | Freq: Two times a day (BID) | OPHTHALMIC | Status: DC
  Administered 2014-06-13 – 2014-06-16 (×6): 1 [drp] via OPHTHALMIC
  Filled 2014-06-13: qty 15

## 2014-06-13 MED ORDER — LEVOTHYROXINE SODIUM 100 MCG PO TABS
100.0000 ug | ORAL_TABLET | Freq: Every day | ORAL | Status: DC
  Administered 2014-06-15 – 2014-06-16 (×2): 100 ug via ORAL
  Filled 2014-06-13 (×3): qty 1

## 2014-06-13 MED ORDER — ACETAMINOPHEN 325 MG PO TABS: 650.0000 mg | ORAL_TABLET | Freq: Four times a day (QID) | ORAL | Status: DC | PRN

## 2014-06-13 MED ORDER — MORPHINE SULFATE 4 MG/ML IJ SOLN: 4.0000 mg | INTRAMUSCULAR | Status: DC | PRN

## 2014-06-13 MED ORDER — RIVAROXABAN 15 MG PO TABS: 15.0000 mg | ORAL_TABLET | Freq: Every day | ORAL | Status: DC

## 2014-06-13 MED ORDER — ADULT MULTIVITAMIN W/MINERALS CH
1.0000 | ORAL_TABLET | Freq: Every day | ORAL | Status: DC
  Administered 2014-06-13 – 2014-06-16 (×4): 1 via ORAL
  Filled 2014-06-13 (×4): qty 1

## 2014-06-13 MED ORDER — PANTOPRAZOLE SODIUM 40 MG PO TBEC
40.0000 mg | DELAYED_RELEASE_TABLET | Freq: Every day | ORAL | Status: DC
  Administered 2014-06-13 – 2014-06-16 (×4): 40 mg via ORAL
  Filled 2014-06-13 (×3): qty 1

## 2014-06-13 MED ORDER — CALCIUM CITRATE-VITAMIN D 500-400 MG-UNIT PO CHEW
1.0000 | CHEWABLE_TABLET | Freq: Every day | ORAL | Status: DC
  Administered 2014-06-13 – 2014-06-15 (×3): 1 via ORAL
  Filled 2014-06-13 (×5): qty 1

## 2014-06-13 NOTE — ED Notes (Signed)
[facility] called and states that they will get pt in the morning for for Bone scan bc they dont normally do stat scans.    [facility] called and stated that they have two patients before they can get to pt.  Made them aware that pt has a bed assignment and will be going up stiars before they come get her.

## 2014-06-13 NOTE — ED Notes (Signed)
Attempted IV access and blood draw x 2, both unsuccessful.

## 2014-06-13 NOTE — H&P (Addendum)
History and PhysicalMarland Kitchen    [name]   WUJ:[id number] DOB: 04-10-1923 DOA: 06/13/2014  Referring physician: Dr. Dorie Rank PCP: Irven Shelling, MD   Consults called: Ortho: Dr. Veverly Fells  Chief Complaint: Worsening hip pain  History of Present Illness:   [name] is an 81 y.o. female with a PMH of osteoarthritis status post hemiarthroplasty of the left hip and right total hip arthroplasty who presents with a several month history of left-sided hip pain, then over the past 3 days, she has developed right sided hip pain.  She has seen Dr. Maureen Ralphs twice with the pain, treated with hydrocodone, now cannot stand or sit because of pain.  Her family reports that she could not put on her clothes today.  She took 2 hydrocodone yesterday, but it didn't help the pain.  She says she does not like to take pain medications due to the fact that it causes constpation and she has a fear of addiciton.  Pain is 9/10 described as a "dull, continuous ache".  The patient has not had any back pain, lower extremity numbness or paresthesias. No recent falls or injuries. She was given morphine in the ED, which has eased her pain.  ROS:   Constitutional: No fever, no chills;  Appetite diminished; No weight loss, no weight gain, + fatigue.  HEENT: No blurry vision, no diplopia, no pharyngitis, no dysphagia CV: No chest pain, + occasional palpitations, no PND, no orthopnea, no edema.  Resp: + SOB with exertion, no cough, no pleuritic pain. GI: + nausea, no vomiting, no diarrhea, no melena, no hematochezia, + constipation, no abdominal pain.  GU: No dysuria, + hematuria, no frequency, no urgency. MSK: no myalgias, + hip arthralgias.  Neuro:  No headache, no focal neurological deficits, no history of seizures.  Psych: No depression, no anxiety.  Endo: No heat intolerance, no cold intolerance, + polyuria, + polydipsia  Skin: No rashes, no skin lesions.  Heme: No easy bruising.  Travel history: No recent  travel.   Past Medical History:   Past Medical History  Diagnosis Date  . GERD (gastroesophageal reflux disease)   . Osteoporosis     fosamax 10 yrs, reclast 3 yrs  . Hypothyroidism   . Scoliosis   . Varicose veins   . Hypertension   . Hx of blood clots 60's    eye rt  . Allergic cough   . Heart murmur   . Peripheral vascular disease     blood clot behind rt eye   . Arthritis   . Thyroid nodule   . History of PSVT (paroxysmal supraventricular tachycardia)   . Primary hyperparathyroidism 2012  . Chronic atrial fibrillation 12/13    normal EF, severe tricuspid reguritation  . PVC's (premature ventricular contractions)     history of  . Facial neuralgia     atypical  . Hyperplastic colon polyp   . Macular degeneration of left eye   . Bronchitis, chronic with acute exacerbation 04/25/2011  . Stage III chronic kidney disease     Past Surgical History:   Past Surgical History  Procedure Laterality Date  . Total hip arthroplasty  1981,1982,2001,2007,    Right and Left  . Reverse shoulder arthroplasty Right 11/201/2014    Dr Onnie Graham  . Reverse shoulder arthroplasty Right 01/07/2013    Procedure: RIGHT REVERSE SHOULDER ARTHROPLASTY;  Surgeon: Marin Shutter, MD;  Location: [place];  Service: Orthopedics;  Laterality: Right;  . Cataract extraction, bilateral  Social History:   History   Social History  . Marital Status: Widowed    Spouse Name: N/A  . Number of Children: 3  . Years of Education: N/A   Occupational History  . 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 Smoking status: Never Smoker   . Smokeless tobacco: Never Used  . Alcohol Use: No  . Drug Use: No  . Sexual Activity: Not on file   Other Topics Concern  . Not on file   Social History Narrative   Husband deceased of colon cancer. Lives alone.  Ambulates with a cane.  Has a walker.    Family history:   Family History  Problem Relation Age of Onset  . Breast  cancer Daughter   . Heart attack Brother   . Stroke Mother   . Rheum arthritis Father   . Lung cancer Father   . Lung cancer Sister     Allergies   Amlodipine besylate; Lodine; Naprosyn; Penicillins; Pneumovax; Robitussin dm; and Sulfonamide derivatives  Current Medications:   Prior to Admission medications   Medication Sig Start Date End Date Taking? Authorizing Provider  Ascorbic Acid (VITAMIN C) 1000 MG tablet Take 1,000 mg by mouth daily.    Yes Historical Provider, MD  bisacodyl (DULCOLAX) 10 MG suppository Place 10 mg rectally daily as needed for mild constipation or moderate constipation.   Yes Historical Provider, MD  calcium citrate-vitamin D (CITRACAL+D) 315-200 MG-UNIT per tablet Take 1 tablet by mouth daily.    Yes Historical Provider, MD  Cyanocobalamin (B-12) 1000 MCG TBCR Take 1 tablet by mouth daily.    Yes Historical Provider, MD  ergocalciferol (VITAMIN D2) 50000 UNITS capsule Take 50,000 Units by mouth every 30 (thirty) days.    Yes Historical Provider, MD  HYDROcodone-acetaminophen (NORCO/VICODIN) 5-325 MG per tablet Take 1-2 tablets by mouth every 8 (eight) hours as needed for moderate pain.  06/09/14  Yes Historical Provider, MD  levothyroxine (SYNTHROID, LEVOTHROID) 100 MCG tablet Take 100 mcg by mouth daily.    Yes Historical Provider, MD  metoprolol tartrate (LOPRESSOR) 25 MG tablet Take 1 tablet (25 mg total) by mouth 2 (two) times daily. 11/19/13  Yes Dorothy Spark, MD  Multiple Vitamin (MULTIVITAMIN) tablet Take 1 tablet by mouth daily.     Yes Historical Provider, MD  NON FORMULARY Take 1 tablet by mouth 2 (two) times daily. Macular Degeneration Supp. Take 1 tab by mouth twice daily.   Yes Historical Provider, MD  omeprazole (PRILOSEC) 20 MG capsule Take 20 mg by mouth every other day.    Yes Historical Provider, MD  Polyvinyl Alcohol-Povidone (REFRESH OP) Apply 1 drop to eye 2 (two) times daily.   Yes Historical Provider, MD  XARELTO 15 MG TABS tablet Take  1 tablet by mouth daily. 04/11/12  Yes Historical Provider, MD    Physical Exam:   Filed Vitals:   06/13/14 1314 06/13/14 1315 06/13/14 1609 06/13/14 1750  BP: 118/59 118/59 153/65 143/69  Pulse: 73 79 118 70  Temp:    98.1 F (36.7 C)  TempSrc:    Oral  Resp: 16 18 19 20   Height:    5\' 2"  (1.575 m)  Weight:    72.7 kg (160 lb 4.4 oz)  SpO2: 97% 97% 100% 98%     Physical Exam: Blood pressure 143/69, pulse 70, temperature 98.1 F (36.7 C), temperature source Oral, resp. rate 20, height 5\' 2"  (1.575 m),  weight 72.7 kg (160 lb 4.4 oz), SpO2 98 %. Gen: No acute distress. Head: Normocephalic, atraumatic. Eyes: PERRL, EOMI, sclerae nonicteric. Lens implants noted bilaterally. Mouth: Oropharynx is clear. Neck: Supple, no thyromegaly, no lymphadenopathy, no jugular venous distention. Chest: Lungs are clear to auscultation bilaterally. CV: Heart sounds are irregularly irregular. No murmurs, rubs, or gallops. Abdomen: Soft, nontender, nondistended with normal active bowel sounds. Extremities: Extremities are without clubbing, edema, or cyanosis. 2+ pedal pulses. Skin: Warm and dry. Neuro: Alert and oriented times 3; grossly nonfocal. Psych: Mood and affect normal.   Data Review:    Labs: Basic Metabolic Panel:  Recent Labs Lab 06/13/14 1154  NA 142  K 4.2  CL 108  CO2 26  GLUCOSE 112*  BUN 16  CREATININE 1.19*  CALCIUM 10.0   CBC:  Recent Labs Lab 06/13/14 1154  WBC 5.7  NEUTROABS 4.2  HGB 10.5*  HCT 31.9*  MCV 96.4  PLT 198    Radiographic Studies: Dg Hips Bilat With Pelvis 3-4 Views  06/13/2014   CLINICAL DATA:  Increasing bilateral hip pain. No new trauma. Chronic hip pain with worsening symptoms.  EXAM: BILATERAL HIP (WITH PELVIS) 3-4 VIEWS  COMPARISON:  None.  FINDINGS: LEFT hip hemiarthroplasty is present. Mild lucency is present along the bone-cement interface in the proximal femur. This suggests the possibility of early loosening.  The visible  obturator rings appear within normal limits.  Complicated RIGHT total hip arthroplasty is present with encircling cables, cement augmentation and dystrophic bone. Encircling cables are fractured. There appears to be medial migration of the distal stem with cortical thickening and remodeling along the distal medial femoral shaft. No periprosthetic fracture is identified. Lucency is present along the femoral stem suggesting loosening. Acetabular cup appears intact. Partially visible lumbar spondylosis.  IMPRESSION: 1. No acute osseous abnormality. 2. LEFT hip hemiarthroplasty with lucency along the bone-cement interface suggesting early loosening. 3. Complicated RIGHT total hip arthroplasty. Broken encircling cables and lucency along the femoral stem with medial migration of the distal stem.   Electronically Signed   By: Dereck Ligas M.D.   On: 06/13/2014 11:51    EKG: Independently reviewed. Atrial fibrillation at 79 bpm with a prolonged PR interval. Q waves in the anterior leads.   Assessment/Plan:   Principal Problem:   Hip pain - Findings on CT showed lucency at the site of the left hip hemiarthroplasty as well as broken encircling cables and lucency along the femoral stem with medial migration of the distal stem on the right. - Orthopedic surgery consulted. Follow-up bone scan and MRI of the lumbar spine. - Continue pain management with Vicodin. Add morphine 4 mg as needed for breakthrough pain. - Patient is afebrile and without leukocytosis, so doubt infection a concern at this time.  Active Problems:   Stage III chronic kidney disease - Baseline creatinine 1.18, current creatinine consistent with usual baseline values.    GERD - Continue PPI therapy.    Constipation - Start daily MiraLAX.    Essential hypertension - Continue metoprolol.    Atrial fibrillation - Continue metoprolol for rate control and Xarelto for anticoagulation.    Hypothyroidism - Continue Synthroid.     DVT prophylaxis - On chronic Xarelto.  Code Status: Full. Family Communication: Ulice Dash (son), Clarene Critchley (daughter-in-law), Lorenda Ishihara (daughter), Wille Glaser (son) at bedside. Disposition Plan: Home when stable.  Time spent: 70 minutes.  RAMA,CHRISTINA [facility] Pager [phone] Cell: [phone]   If 7PM-7AM, please contact night-coverage [email or web address] Password [facility] 06/13/2014, 6:58 PM

## 2014-06-13 NOTE — Consult Note (Signed)
[name] is an 81 y.o. female.    Chief Complaint: bilateral hip pain  HPI: 81 y/o female with long standing history with Dr. Wynelle Link s/p hemi arthroplasty left hip and right total hip arthroplasty. Patient has been c/o left hip pain for the past 6 weeks and has been evaluated in the office multiple times. Yesterday pt c/o worsening bilateral hip pain yesterday mainly with weight bearing. Denies any lumbar pain, numbness, or tingling distally to either extremity. Denies any falls or injuries. Feeling better since pain injection in ED. Denies any bowel or bladder symptoms. Pt lives at home with good family support. Had some recent constipation with recent pain medication use. C/o nausea yesterday but none today. No vomiting  PCP:  Irven Shelling, MD  PMH: Past Medical History  Diagnosis Date  . GERD (gastroesophageal reflux disease)   . Osteoporosis     fosamax 10 yrs, reclast 3 yrs  . Hypothyroidism   . Scoliosis   . Varicose veins   . Hypertension   . Hx of blood clots 60's    eye rt  . Allergic cough   . Heart murmur   . Peripheral vascular disease     blood clot behind rt eye   . Arthritis   . Thyroid nodule   . History of PSVT (paroxysmal supraventricular tachycardia)   . Primary hyperparathyroidism 2012  . Chronic atrial fibrillation 12/13    normal EF, severe tricuspid reguritation  . PVC's (premature ventricular contractions)     history of  . Facial neuralgia     atypical  . Hyperplastic colon polyp   . Macular degeneration of left eye     PSH: Past Surgical History  Procedure Laterality Date  . Total hip arthroplasty  1981,1982,2001,2007,    Right and Left  . Reverse shoulder arthroplasty Right 11/201/2014    Dr Onnie Graham  . Reverse shoulder arthroplasty Right 01/07/2013    Procedure: RIGHT REVERSE SHOULDER ARTHROPLASTY;  Surgeon: Marin Shutter, MD;  Location: [place];  Service: Orthopedics;  Laterality: Right;    Social History:  reports that she  has never smoked. She has never used smokeless tobacco. She reports that she does not drink alcohol or use illicit drugs.  Allergies:  Allergies  Allergen Reactions  . Amlodipine Besylate Swelling  . Lodine [Etodolac] Hives  . Naprosyn [Naproxen] Hives  . Penicillins Hives    REACTION: hives  . Pneumovax [Pneumococcal Polysaccharide Vaccine]     Whelps   . Robitussin Dm [Dextromethorphan-Guaifenesin] Hives  . Sulfonamide Derivatives Hives    REACTION: hives    Medications: Current Facility-Administered Medications  Medication Dose Route Frequency Provider Last Rate Last Dose  . 0.9 %  sodium chloride infusion   Intravenous Continuous Dorie Rank, MD 125 mL/hr at 06/13/14 1154     Current Outpatient Prescriptions  Medication Sig Dispense Refill  . Ascorbic Acid (VITAMIN C) 1000 MG tablet Take 1,000 mg by mouth daily.     . bisacodyl (DULCOLAX) 10 MG suppository Place 10 mg rectally daily as needed for mild constipation or moderate constipation.    . calcium citrate-vitamin D (CITRACAL+D) 315-200 MG-UNIT per tablet Take 1 tablet by mouth daily.     . Cyanocobalamin (B-12) 1000 MCG TBCR Take 1 tablet by mouth daily.     . ergocalciferol (VITAMIN D2) 50000 UNITS capsule Take 50,000 Units by mouth every 30 (thirty) days.     Marland Kitchen HYDROcodone-acetaminophen (NORCO/VICODIN) 5-325 MG per tablet Take 1-2 tablets by mouth every  8 (eight) hours as needed for moderate pain.   0  . levothyroxine (SYNTHROID, LEVOTHROID) 100 MCG tablet Take 100 mcg by mouth daily.     . metoprolol tartrate (LOPRESSOR) 25 MG tablet Take 1 tablet (25 mg total) by mouth 2 (two) times daily. 180 tablet 6  . Multiple Vitamin (MULTIVITAMIN) tablet Take 1 tablet by mouth daily.      . NON FORMULARY Take 1 tablet by mouth 2 (two) times daily. Macular Degeneration Supp. Take 1 tab by mouth twice daily.    Marland Kitchen omeprazole (PRILOSEC) 20 MG capsule Take 20 mg by mouth every other day.     . Polyvinyl Alcohol-Povidone (REFRESH OP)  Apply 1 drop to eye 2 (two) times daily.    Alveda Reasons 15 MG TABS tablet Take 1 tablet by mouth daily.      Results for orders placed or performed during the hospital encounter of 06/13/14 (from the past 48 hour(s))  I-stat troponin, ED     Status: None   Collection Time: 06/13/14 11:42 AM  Result Value Ref Range   Troponin i, poc 0.00 0.00 - 0.08 ng/mL   Comment 3            Comment: Due to the release kinetics of cTnI, a negative result within the first hours of the onset of symptoms does not rule out myocardial infarction with certainty. If myocardial infarction is still suspected, repeat the test at appropriate intervals.   CBC with Differential     Status: Abnormal   Collection Time: 06/13/14 11:54 AM  Result Value Ref Range   WBC 5.7 4.0 - 10.5 K/uL   RBC 3.31 (L) 3.87 - 5.11 MIL/uL   Hemoglobin 10.5 (L) 12.0 - 15.0 g/dL   HCT 31.9 (L) 36.0 - 46.0 %   MCV 96.4 78.0 - 100.0 fL   MCH 31.7 26.0 - 34.0 pg   MCHC 32.9 30.0 - 36.0 g/dL   RDW 13.5 11.5 - 15.5 %   Platelets 198 150 - 400 K/uL   Neutrophils Relative % 72 43 - 77 %   Neutro Abs 4.2 1.7 - 7.7 K/uL   Lymphocytes Relative 19 12 - 46 %   Lymphs Abs 1.1 0.7 - 4.0 K/uL   Monocytes Relative 7 3 - 12 %   Monocytes Absolute 0.4 0.1 - 1.0 K/uL   Eosinophils Relative 2 0 - 5 %   Eosinophils Absolute 0.1 0.0 - 0.7 K/uL   Basophils Relative 0 0 - 1 %   Basophils Absolute 0.0 0.0 - 0.1 K/uL  Basic metabolic panel     Status: Abnormal   Collection Time: 06/13/14 11:54 AM  Result Value Ref Range   Sodium 142 135 - 145 mmol/L   Potassium 4.2 3.5 - 5.1 mmol/L   Chloride 108 96 - 112 mmol/L   CO2 26 19 - 32 mmol/L   Glucose, Bld 112 (H) 70 - 99 mg/dL   BUN 16 6 - 23 mg/dL   Creatinine, Ser 1.19 (H) 0.50 - 1.10 mg/dL   Calcium 10.0 8.4 - 10.5 mg/dL   GFR calc non Af Amer 40 (L) >90 mL/min   GFR calc Af Amer 46 (L) >90 mL/min    Comment: (NOTE) The eGFR has been calculated using the CKD EPI equation. This calculation has  not been validated in all clinical situations. eGFR's persistently <90 mL/min signify possible Chronic Kidney Disease.    Anion gap 8 5 - 15  Protime-INR  Status: Abnormal   Collection Time: 06/13/14 11:54 AM  Result Value Ref Range   Prothrombin Time 20.2 (H) 11.6 - 15.2 seconds   INR 1.71 (H) 0.00 - 1.49  Urinalysis, Routine w reflex microscopic     Status: Abnormal   Collection Time: 06/13/14 12:52 PM  Result Value Ref Range   Color, Urine YELLOW YELLOW   APPearance CLEAR CLEAR   Specific Gravity, Urine 1.008 1.005 - 1.030   pH 6.0 5.0 - 8.0   Glucose, UA NEGATIVE NEGATIVE mg/dL   Hgb urine dipstick NEGATIVE NEGATIVE   Bilirubin Urine NEGATIVE NEGATIVE   Ketones, ur NEGATIVE NEGATIVE mg/dL   Protein, ur NEGATIVE NEGATIVE mg/dL   Urobilinogen, UA 0.2 0.0 - 1.0 mg/dL   Nitrite NEGATIVE NEGATIVE   Leukocytes, UA SMALL (A) NEGATIVE  Urine microscopic-add on     Status: None   Collection Time: 06/13/14 12:52 PM  Result Value Ref Range   Squamous Epithelial / LPF RARE RARE   WBC, UA 3-6 <3 WBC/hpf    Comment: WBC CLUMPS   Bacteria, UA RARE RARE   Dg Hips Bilat With Pelvis 3-4 Views  06/13/2014   CLINICAL DATA:  Increasing bilateral hip pain. No new trauma. Chronic hip pain with worsening symptoms.  EXAM: BILATERAL HIP (WITH PELVIS) 3-4 VIEWS  COMPARISON:  None.  FINDINGS: LEFT hip hemiarthroplasty is present. Mild lucency is present along the bone-cement interface in the proximal femur. This suggests the possibility of early loosening.  The visible obturator rings appear within normal limits.  Complicated RIGHT total hip arthroplasty is present with encircling cables, cement augmentation and dystrophic bone. Encircling cables are fractured. There appears to be medial migration of the distal stem with cortical thickening and remodeling along the distal medial femoral shaft. No periprosthetic fracture is identified. Lucency is present along the femoral stem suggesting loosening.  Acetabular cup appears intact. Partially visible lumbar spondylosis.  IMPRESSION: 1. No acute osseous abnormality. 2. LEFT hip hemiarthroplasty with lucency along the bone-cement interface suggesting early loosening. 3. Complicated RIGHT total hip arthroplasty. Broken encircling cables and lucency along the femoral stem with medial migration of the distal stem.   Electronically Signed   By: Dereck Ligas M.D.   On: 06/13/2014 11:51    ROS: ROS Pain with weight bearing bilateral lower hips Recent constipation and nausea  Physical Exam: Alert and appropriate 81 y/o female in no acute distress Lumbar spine with no tenderness and decent rom Right hip: full rom without pain, no trochanteric tenderness nv intact distally No pain with log roll Left hip: mild trochanteric tenderness but no pain with log roll Minimal pain with knee and hip flexion nv intact distally No rashes or edema distally Antalgic gait  Physical Exam   X-rays- s/p right total hip arthroplasty and left hip hemi arthroplasty with no signs of change from previous x-rays in the office  Assessment/Plan Assessment: Bilateral hip pain Scoliosis on plain films with possible nerve impingement to left lower extremity  Plan: Discussed case with Dr. Wynelle Link who recommends work up of both the left hip with a bone scan and the lumbar spine with MRI No white count and good exam does not suggest infection  Pain management  Activity as tolerated with aid of PT/OT Hospitalist to admit and possible SNF placement  Will monitor her progress thru hospital stay

## 2014-06-13 NOTE — ED Notes (Addendum)
Pt presents with c/o hip pain x 6 weeks. Pt has a hx of bilateral hip replacement. Pt reports no new injury. Pt also c/o nausea that has been going on over this weekend. Pt taking some pain medicine from her doctor for her hips currently.

## 2014-06-13 NOTE — ED Provider Notes (Addendum)
CSN: [id number]     Arrival date & time 06/13/14  1041 History   First MD Initiated Contact with Patient 06/13/14 1045     Chief Complaint  Patient presents with  . Hip Pain  . Nausea    HPI Patient presents to the emergency room with complaints of persistent bilateral hip pain for the past 6 weeks. Patient does have a history of bilateral hip replacements. She has not had any recent injuries or falls. When she first started having the pain she followed up with her orthopedic doctor, Dr. Elmyra Ricks.  Patient was told the symptoms may be related to bursitis. She was put on hydrocodone. The patient did follow up one other time but her symptoms persisted. Initially the pain was primarily in her left hip but starting this weekend it moved to her right side as well. She denies any trouble with fevers or chills. She denies any trouble with abdominal pain. She denies any back pain. She denies any numbness or weakness.   This week and the pain increased in severity. She also had the pain move to the other side. She decided to come to the emergency room for evaluation. Past Medical History  Diagnosis Date  . GERD (gastroesophageal reflux disease)   . Osteoporosis     fosamax 10 yrs, reclast 3 yrs  . Hypothyroidism   . Scoliosis   . Varicose veins   . Hypertension   . Hx of blood clots 60's    eye rt  . Allergic cough   . Heart murmur   . Peripheral vascular disease     blood clot behind rt eye   . Arthritis   . Thyroid nodule   . History of PSVT (paroxysmal supraventricular tachycardia)   . Primary hyperparathyroidism 2012  . Chronic atrial fibrillation 12/13    normal EF, severe tricuspid reguritation  . PVC's (premature ventricular contractions)     history of  . Facial neuralgia     atypical  . Hyperplastic colon polyp   . Macular degeneration of left eye    Past Surgical History  Procedure Laterality Date  . Total hip arthroplasty  1981,1982,2001,2007,    Right and Left  . Reverse  shoulder arthroplasty Right 11/201/2014    Dr Onnie Graham  . Reverse shoulder arthroplasty Right 01/07/2013    Procedure: RIGHT REVERSE SHOULDER ARTHROPLASTY;  Surgeon: Marin Shutter, MD;  Location: [place];  Service: Orthopedics;  Laterality: Right;   Family History  Problem Relation Age of Onset  . Breast cancer Daughter   . Heart attack Brother   . Stroke Mother   . Rheum arthritis Father   . Lung cancer Father   . Lung cancer Sister    History  Substance Use Topics  . Smoking status: Never Smoker   . Smokeless tobacco: Never Used  . Alcohol Use: No   OB History    No data available     Review of Systems  All other systems reviewed and are negative.     Allergies  Amlodipine besylate; Lodine; Naprosyn; Penicillins; Pneumovax; Robitussin dm; and Sulfonamide derivatives  Home Medications   Prior to Admission medications   Medication Sig Start Date End Date Taking? Authorizing Provider  Ascorbic Acid (VITAMIN C) 1000 MG tablet Take 1,000 mg by mouth daily.    Yes Historical Provider, MD  bisacodyl (DULCOLAX) 10 MG suppository Place 10 mg rectally daily as needed for mild constipation or moderate constipation.   Yes Historical Provider, MD  calcium  citrate-vitamin D (CITRACAL+D) 315-200 MG-UNIT per tablet Take 1 tablet by mouth daily.    Yes Historical Provider, MD  Cyanocobalamin (B-12) 1000 MCG TBCR Take 1 tablet by mouth daily.    Yes Historical Provider, MD  ergocalciferol (VITAMIN D2) 50000 UNITS capsule Take 50,000 Units by mouth every 30 (thirty) days.    Yes Historical Provider, MD  HYDROcodone-acetaminophen (NORCO/VICODIN) 5-325 MG per tablet Take 1-2 tablets by mouth every 8 (eight) hours as needed for moderate pain.  06/09/14  Yes Historical Provider, MD  levothyroxine (SYNTHROID, LEVOTHROID) 100 MCG tablet Take 100 mcg by mouth daily.    Yes Historical Provider, MD  metoprolol tartrate (LOPRESSOR) 25 MG tablet Take 1 tablet (25 mg total) by mouth 2 (two) times daily.  11/19/13  Yes Dorothy Spark, MD  Multiple Vitamin (MULTIVITAMIN) tablet Take 1 tablet by mouth daily.     Yes Historical Provider, MD  NON FORMULARY Take 1 tablet by mouth 2 (two) times daily. Macular Degeneration Supp. Take 1 tab by mouth twice daily.   Yes Historical Provider, MD  omeprazole (PRILOSEC) 20 MG capsule Take 20 mg by mouth every other day.    Yes Historical Provider, MD  Polyvinyl Alcohol-Povidone (REFRESH OP) Apply 1 drop to eye 2 (two) times daily.   Yes Historical Provider, MD  XARELTO 15 MG TABS tablet Take 1 tablet by mouth daily. 04/11/12  Yes Historical Provider, MD   BP 118/59 mmHg  Pulse 73  Temp(Src) 97.8 F (36.6 C) (Oral)  Resp 16  Ht 5\' 2"  (1.575 m)  Wt 159 lb (72.122 kg)  BMI 29.07 kg/m2  SpO2 97% Physical Exam  Constitutional: She appears well-developed and well-nourished. No distress.  HENT:  Head: Normocephalic and atraumatic.  Right Ear: External ear normal.  Left Ear: External ear normal.  Eyes: Conjunctivae are normal. Right eye exhibits no discharge. Left eye exhibits no discharge. No scleral icterus.  Neck: Neck supple. No tracheal deviation present.  Cardiovascular: Normal rate, regular rhythm and intact distal pulses.   Pulmonary/Chest: Effort normal and breath sounds normal. No stridor. No respiratory distress. She has no wheezes. She has no rales.  Abdominal: Soft. Bowel sounds are normal. She exhibits no distension. There is no tenderness. There is no rebound and no guarding.  Musculoskeletal: She exhibits no edema.       Right hip: She exhibits tenderness. She exhibits normal strength, no swelling and no deformity.       Left hip: She exhibits tenderness. She exhibits normal strength, no swelling and no deformity.  Normal pulses bilateral dorsalis pedis, warm well perfused without erythema distally; well-healed scars bilateral hip joints without any evidence of erythema or edema  Neurological: She is alert. She has normal strength. No  cranial nerve deficit (no facial droop, extraocular movements intact, no slurred speech) or sensory deficit. She exhibits normal muscle tone. She displays no seizure activity. Coordination normal.  Skin: Skin is warm and dry. No rash noted.  Psychiatric: She has a normal mood and affect.  Nursing note and vitals reviewed.   ED Course  Procedures (including critical care time) Labs Review Labs Reviewed  CBC WITH DIFFERENTIAL/PLATELET - Abnormal; Notable for the following:    RBC 3.31 (*)    Hemoglobin 10.5 (*)    HCT 31.9 (*)    All other components within normal limits  BASIC METABOLIC PANEL - Abnormal; Notable for the following:    Glucose, Bld 112 (*)    Creatinine, Ser 1.19 (*)  GFR calc non Af Amer 40 (*)    GFR calc Af Amer 46 (*)    All other components within normal limits  URINALYSIS, ROUTINE W REFLEX MICROSCOPIC - Abnormal; Notable for the following:    Leukocytes, UA SMALL (*)    All other components within normal limits  PROTIME-INR - Abnormal; Notable for the following:    Prothrombin Time 20.2 (*)    INR 1.71 (*)    All other components within normal limits  URINE MICROSCOPIC-ADD ON  I-STAT TROPOININ, ED    Imaging Review Dg Hips Bilat With Pelvis 3-4 Views  06/13/2014   CLINICAL DATA:  Increasing bilateral hip pain. No new trauma. Chronic hip pain with worsening symptoms.  EXAM: BILATERAL HIP (WITH PELVIS) 3-4 VIEWS  COMPARISON:  None.  FINDINGS: LEFT hip hemiarthroplasty is present. Mild lucency is present along the bone-cement interface in the proximal femur. This suggests the possibility of early loosening.  The visible obturator rings appear within normal limits.  Complicated RIGHT total hip arthroplasty is present with encircling cables, cement augmentation and dystrophic bone. Encircling cables are fractured. There appears to be medial migration of the distal stem with cortical thickening and remodeling along the distal medial femoral shaft. No periprosthetic  fracture is identified. Lucency is present along the femoral stem suggesting loosening. Acetabular cup appears intact. Partially visible lumbar spondylosis.  IMPRESSION: 1. No acute osseous abnormality. 2. LEFT hip hemiarthroplasty with lucency along the bone-cement interface suggesting early loosening. 3. Complicated RIGHT total hip arthroplasty. Broken encircling cables and lucency along the femoral stem with medial migration of the distal stem.   Electronically Signed   By: Dereck Ligas M.D.   On: 06/13/2014 11:51     EKG Interpretation   Date/Time:  Monday June 13 2014 12:05:29 EDT Ventricular Rate:  79 PR Interval:  238 QRS Duration: 77 QT Interval:  384 QTC Calculation: 440 R Axis:   1 Text Interpretation:  Atrial fibrillation Prolonged PR interval Low  voltage, precordial leads Probable anteroseptal infarct, old No  significant change since last tracing Confirmed by Anajulia Leyendecker  MD-J, Charels Stambaugh  ([id number]) on 06/13/2014 12:16:03 PM      MDM   Final diagnoses:  Hip pain    Doubt acute infection.  No fever.  Normal WBC count.  Xray does show possible lucency.  Discussed with Dr Veverly Fells who will see the patient in the ED and suggest further treatment.    Dorie Rank, MD 06/13/14 1557  Pt seen by Merla Riches.  Ortho requests medical admission.  Will plan on further workup, MRI spine and possible bone scans.  Dorie Rank, MD 06/13/14 [phone]

## 2014-06-14 ENCOUNTER — Inpatient Hospital Stay (HOSPITAL_COMMUNITY): Payer: Medicare Other

## 2014-06-14 ENCOUNTER — Encounter (HOSPITAL_COMMUNITY): Payer: Medicare Other

## 2014-06-14 DIAGNOSIS — M25559 Pain in unspecified hip: Secondary | ICD-10-CM

## 2014-06-14 MED ORDER — TECHNETIUM TC 99M MEDRONATE IV KIT: 25.0000 | PACK | Freq: Once | INTRAVENOUS | Status: AC | PRN

## 2014-06-14 NOTE — Progress Notes (Signed)
Patient Demographics  [name], is a 81 y.o. female, DOB - 22-May-1923, GHW:[id number]  Admit date - 06/13/2014   Admitting Physician Venetia Maxon Rama, MD  Outpatient Primary MD for the patient is Irven Shelling, MD  LOS - 1   Chief Complaint  Patient presents with  . Hip Pain  . Nausea        Subjective:   Thu [place] today has, No headache, No chest pain, No abdominal pain - No Nausea, No new weakness tingling or numbness, No Cough - SOB. Still reports B/L hip pain .  Assessment & Plan    Principal Problem:   Hip pain Active Problems:   GERD   Constipation   Essential hypertension   Atrial fibrillation   Hypothyroidism   Stage III chronic kidney disease  B/L Hip pain: -  Findings on x-ray showed lucency at the site of the left hip hemiarthroplasty as well as broken encircling cables and lucency along the femoral stem with medial migration of the distal stem on the right ( discussed with Dr. Maureen Ralphs, these are chronic findings, and unlikely the cause of her symptoms) - Orthopedic surgery consulted.  - Continue with pain medicine as needed - Doubt infection, as afebrile, leukocytosis, bone scan with no evidence of infection. - MRI of lumbar spine showing severe lumbar scoliosis, with severe spinal stenosis at L4-L5, and severe left foraminal stenosis at L5-S1, discussed with neurosurgery Dr Kathyrn Sheriff, given patient severe scoliosis, and multilevel of joint disease, surgery won't be an option here.   Chronic kidney disease stage III - Creatinine is at baseline  GERD - Continue PPI therapy.   Constipation - on daily MiraLAX.   Essential hypertension - Continue metoprolol.   Atrial fibrillation - Continue metoprolol for rate control and Xarelto for anticoagulation.   Hypothyroidism - Continue Synthroid.   Code Status:  Full  Family Communication: Son at bedside  Disposition Plan: [facility] consult PT/OT   Procedures None   Consults   Orthopedic   Medications  Scheduled Meds: . calcium citrate-vitamin D  1 tablet Oral Daily  . levothyroxine  100 mcg Oral QAC breakfast  . metoprolol tartrate  25 mg Oral BID  . multivitamin with minerals  1 tablet Oral Daily  . pantoprazole  40 mg Oral Daily  . polyethylene glycol  17 g Oral Daily  . polyvinyl alcohol  1 drop Both Eyes BID  . Rivaroxaban  15 mg Oral Q supper  . vitamin B-12  1,000 mcg Oral Daily  . vitamin C  1,000 mg Oral Daily   Continuous Infusions:  PRN Meds:.acetaminophen **OR** acetaminophen, alum & mag hydroxide-simeth, bisacodyl, HYDROcodone-acetaminophen, morphine injection, ondansetron **OR** ondansetron (ZOFRAN) IV, technetium medronate  DVT Prophylaxis on Xarelto  Lab Results  Component Value Date   PLT 198 06/13/2014    Antibiotics   Anti-infectives    None          Objective:   Filed Vitals:   06/13/14 1750 06/13/14 2211 06/14/14 0510 06/14/14 1431  BP: 143/69 122/59 134/66 124/93  Pulse: 70 73 61 67  Temp: 98.1 F (36.7 C) 97.7 F (36.5 C) 97.7 F (36.5 C) 98.3 F (36.8 C)  TempSrc: Oral Oral Oral Oral  Resp: 20 20 16  16  Height: 5\' 2"  (1.575 m)     Weight: 72.7 kg (160 lb 4.4 oz)     SpO2: 98% 97% 98% 98%    Wt Readings from Last 3 Encounters:  06/13/14 72.7 kg (160 lb 4.4 oz)  11/19/13 73.029 kg (161 lb)  01/07/13 73.891 kg (162 lb 14.4 oz)     Intake/Output Summary (Last 24 hours) at 06/14/14 1537 Last data filed at 06/14/14 0730  Gross per 24 hour  Intake      0 ml  Output   1100 ml  Net  -1100 ml     Physical Exam  Awake Alert, Oriented X 3, No new F.N deficits, Normal affect [place].AT,PERRAL Supple Neck,No JVD, No cervical lymphadenopathy appriciated.  Symmetrical Chest wall movement, Good air movement bilaterally, CTAB RRR,No Gallops,Rubs or new Murmurs, No Parasternal Heave +ve  B.Sounds, Abd Soft, No tenderness, No organomegaly appriciated, No rebound - guarding or rigidity.  No Cyanosis, Clubbing or edema, No new Rash or bruise  , tender to palpation on lower back area, and bilateral hip areas.   Data Review   Micro Results No results found for this or any previous visit (from the past 240 hour(s)).  Radiology Reports Mr Lumbar Spine Wo Contrast  06/14/2014   CLINICAL DATA:  Low back pain and right leg pain.  EXAM: MRI LUMBAR SPINE WITHOUT CONTRAST  TECHNIQUE: Multiplanar, multisequence MR imaging of the lumbar spine was performed. No intravenous contrast was administered.  COMPARISON:  Abdomen radiograph dated 09/05/2005  FINDINGS: Conus tip is at L2-3. Fairly severe lumbar scoliosis with convexity to the left centered at L2-3, approximately 50 degrees. Paraspinal soft tissues are normal. Tiny stones in the otherwise normal appearing gallbladder. Small cysts in both kidneys.  T11-12: Normal.  T12-L1: Small broad-based disc bulge with hypertrophy of the ligamentum flavum and facet joints with narrowing of the left lateral recess which could affect the left T12 and L1 nerves. Fairly severe left foraminal stenosis.  L1-2: Broad-based disc bulge with accompanying osteophytes with hypertrophy of the right facet joint and ligamentum flavum with the right lateral recess stenosis and severe right foraminal stenosis. This could affect the right L1 and L2 nerves.  L2-3: Disc space narrowing with osteophytes narrowing the right lateral recess and right neural foramen severe right foraminal stenosis. Hypertrophy of the right facet joint and ligamentum flavum create the stenosis of the foramen. This could affect the right L2 nerve.  L3-4: Disc bulge and osteophytes create severe right foraminal stenosis. Hypertrophy of the ligamentum flavum facet joints Create moderately severe spinal stenosis and severe right lateral recess stenosis. The findings could affect the right L3 and L4 nerves.   L4-5: Left lateral subluxation of L4 on L5 and disc space narrowing as well as hypertrophy of the facet joints combine to create very severe spinal stenosis with almost complete occlusion of the spinal canal. The left superior facet of L5 occludes most of the spinal canal. Small broad-based disc protrusion and accompanying osteophytes occlude most of the rest of the canal. Severe right and left foraminal stenosis.  L5-S1: 5 mm retrolisthesis of L5 on S1. Marked hypertrophy of the left facet joint and ligamentum flavum severely compress the thecal sac and left lateral recess. Severe left foraminal stenosis. The left S1 nerve is also compressed.  IMPRESSION: 1. Severe spinal stenosis at L4-5 with almost complete obliteration of the spinal canal. 2. Severe left foraminal stenosis and lateral recess stenosis at L5-S1. 3. Diffuse degenerative disc and joint disease throughout  the lumbar spine create neural impingement at each level as described above.   Electronically Signed   By: Lorriane Shire M.D.   On: 06/14/2014 07:42   Nm Bone Scan 3 Phase  06/14/2014   CLINICAL DATA:  Bilateral hip pain. Right hip replacement 10 years prior. Left hip replacement 30 years prior.  EXAM: NUCLEAR MEDICINE 3-PHASE BONE SCAN  TECHNIQUE: Radionuclide angiographic images, immediate static blood pool images, and 3-hour delayed static images were obtained of the hips after intravenous injection of radiopharmaceutical.  RADIOPHARMACEUTICALS:  24.2 mCi Technetium-5m MDP IV  COMPARISON:  None.  FINDINGS: Vascular phase: No asymmetric or increased blood flow to the left or right hip.  Blood pool phase: No asymmetric or increased blood pool activity.  Delayed phase: There is no significant abnormal radiotracer uptake to suggest loosening of the left or right prosthetic. There is uptake along the medial aspect of the proximal right femur which correlates with heterotopic ossification on plain film.  IMPRESSION: No evidence of loosening or  infection of left right hip prosthetic.   Electronically Signed   By: Suzy Bouchard M.D.   On: 06/14/2014 15:10   Dg Hips Bilat With Pelvis 3-4 Views  06/13/2014   CLINICAL DATA:  Increasing bilateral hip pain. No new trauma. Chronic hip pain with worsening symptoms.  EXAM: BILATERAL HIP (WITH PELVIS) 3-4 VIEWS  COMPARISON:  None.  FINDINGS: LEFT hip hemiarthroplasty is present. Mild lucency is present along the bone-cement interface in the proximal femur. This suggests the possibility of early loosening.  The visible obturator rings appear within normal limits.  Complicated RIGHT total hip arthroplasty is present with encircling cables, cement augmentation and dystrophic bone. Encircling cables are fractured. There appears to be medial migration of the distal stem with cortical thickening and remodeling along the distal medial femoral shaft. No periprosthetic fracture is identified. Lucency is present along the femoral stem suggesting loosening. Acetabular cup appears intact. Partially visible lumbar spondylosis.  IMPRESSION: 1. No acute osseous abnormality. 2. LEFT hip hemiarthroplasty with lucency along the bone-cement interface suggesting early loosening. 3. Complicated RIGHT total hip arthroplasty. Broken encircling cables and lucency along the femoral stem with medial migration of the distal stem.   Electronically Signed   By: Dereck Ligas M.D.   On: 06/13/2014 11:51    CBC  Recent Labs Lab 06/13/14 1154  WBC 5.7  HGB 10.5*  HCT 31.9*  PLT 198  MCV 96.4  MCH 31.7  MCHC 32.9  RDW 13.5  LYMPHSABS 1.1  MONOABS 0.4  EOSABS 0.1  BASOSABS 0.0    Chemistries   Recent Labs Lab 06/13/14 1154  NA 142  K 4.2  CL 108  CO2 26  GLUCOSE 112*  BUN 16  CREATININE 1.19*  CALCIUM 10.0   ------------------------------------------------------------------------------------------------------------------ estimated creatinine clearance is 30.5 mL/min (by C-G formula based on Cr of  1.19). ------------------------------------------------------------------------------------------------------------------ No results for input(s): HGBA1C in the last 72 hours. ------------------------------------------------------------------------------------------------------------------ No results for input(s): CHOL, HDL, LDLCALC, TRIG, CHOLHDL, LDLDIRECT in the last 72 hours. ------------------------------------------------------------------------------------------------------------------ No results for input(s): TSH, T4TOTAL, T3FREE, THYROIDAB in the last 72 hours.  Invalid input(s): FREET3 ------------------------------------------------------------------------------------------------------------------ No results for input(s): VITAMINB12, FOLATE, FERRITIN, TIBC, IRON, RETICCTPCT in the last 72 hours.  Coagulation profile  Recent Labs Lab 06/13/14 1154  INR 1.71*    No results for input(s): DDIMER in the last 72 hours.  Cardiac Enzymes No results for input(s): CKMB, TROPONINI, MYOGLOBIN in the last 168 hours.  Invalid input(s): CK ------------------------------------------------------------------------------------------------------------------ Invalid  input(s): POCBNP     Time Spent in minutes   30 minutes   Sharronda Schweers M.D on 06/14/2014 at 3:37 PM  Between 7am to 7pm - Pager - [phone]  After 7pm go to [email or web address] - password [email or web address]  And look for the night coverage person covering for me after hours  [facility] Office  [phone]   **Disclaimer: This note may have been dictated with voice recognition software. Similar sounding words can inadvertently be transcribed and this note may contain transcription errors which may not have been corrected upon publication of note.**

## 2014-06-15 NOTE — Evaluation (Signed)
Physical Therapy One Time Evaluation Patient Details Name: [name] MRN: [id number] DOB: 1923-12-24 Today's Date: 06/15/2014   History of Present Illness  Pt is an 81 year old female admitted with bilateral hip pain with hx of R THA and L hip hemiarthroplasty.  MRI of lumbar spine showing severe lumbar scoliosis, with severe spinal stenosis at L4-L5, and severe left foraminal stenosis at L5-S1  Clinical Impression  Patient evaluated by Physical Therapy with no further acute PT needs identified. All education has been completed and the patient has no further questions. Pt mobilizing well and without pain during evaluation.  Pt reports she was just premedicated, however pain can increase to 9/10 in her buttocks radiating to her lateral hips bilaterally.  Recommended pt use RW upon d/c for pain control and safety as well as educated pt on back precautions to assist with pain control.  Pt reports being very active and volunteers in community.  No further follow-up Physial Therapy or equipment needs identified at this time. PT is signing off. Thank you for this referral.     Follow Up Recommendations No PT follow up    Equipment Recommendations  None recommended by PT    Recommendations for Other Services       Precautions / Restrictions Precautions Precautions: Back Precaution Comments: encouraged back precautions       Mobility  Bed Mobility               General bed mobility comments: pt up in recliner on arrival  Transfers Overall transfer level: Needs assistance Equipment used: Rolling walker (2 wheeled) Transfers: Sit to/from Stand Sit to Stand: Supervision         General transfer comment: verbal cues for hand placement  Ambulation/Gait Ambulation/Gait assistance: Supervision Ambulation Distance (Feet): 260 Feet Assistive device: Rolling walker (2 wheeled) Gait Pattern/deviations: Step-through pattern;Trunk flexed     General Gait Details: pt educated on  safe use of RW, no symptoms or pain during mobility   Stairs            Wheelchair Mobility    Modified Rankin (Stroke Patients Only)       Balance Overall balance assessment:  (pt reports no hx of falls)                                           Pertinent Vitals/Pain Pain Assessment: No/denies pain (premedicated, no increased in pain with mobility)    Home Living Family/patient expects to be discharged to:: Private residence Living Arrangements: Alone Available Help at Discharge: Family Type of Home: House Home Access: Stairs to enter   Technical brewer of Steps: 1 Home Layout: One level Home Equipment: Cane - single point;Walker - 2 wheels      Prior Function Level of Independence: Independent with assistive device(s)         Comments: typically uses SPC     Hand Dominance        Extremity/Trunk Assessment               Lower Extremity Assessment: Overall WFL for tasks assessed         Communication   Communication: No difficulties  Cognition Arousal/Alertness: Awake/alert Behavior During Therapy: WFL for tasks assessed/performed Overall Cognitive Status: Within Functional Limits for tasks assessed  General Comments      Exercises        Assessment/Plan    PT Assessment Patent does not need any further PT services  PT Diagnosis     PT Problem List    PT Treatment Interventions     PT Goals (Current goals can be found in the Care Plan section) Acute Rehab PT Goals PT Goal Formulation: All assessment and education complete, DC therapy    Frequency     Barriers to discharge        Co-evaluation               End of Session   Activity Tolerance: Patient tolerated treatment well;No increased pain Patient left: in chair;with call bell/phone within reach;with family/visitor present;with nursing/sitter in room           Time: 9147-8295 PT Time Calculation  (min) (ACUTE ONLY): 16 min   Charges:   PT Evaluation $Initial PT Evaluation Tier I: 1 Procedure     PT G Codes:        Florine Sprenkle,KATHrine E 06/15/2014, 11:08 AM Carmelia Bake, PT, DPT 06/15/2014 Pager: [phone]

## 2014-06-15 NOTE — Progress Notes (Signed)
Subjective: Patient is complaining of severe buttock and posterior thigh pain bilaterally, worse with attempted ambulation.  Objective: Vital signs in last 24 hours: Temp:  [97.5 F (36.4 C)-97.8 F (36.6 C)] 97.7 F (36.5 C) (04/27 1500) Pulse Rate:  [56-68] 60 (04/27 1500) Resp:  [16-18] 18 (04/27 1500) BP: (142-149)/(58-76) 149/76 mmHg (04/27 1500) SpO2:  [97 %] 97 % (04/27 1500)  Intake/Output from previous day: 04/26 0701 - 04/27 0700 In: -  Out: 600 [Urine:600] Intake/Output this shift:     Recent Labs  06/13/14 1154  HGB 10.5*    Recent Labs  06/13/14 1154  WBC 5.7  RBC 3.31*  HCT 31.9*  PLT 198    Recent Labs  06/13/14 1154  NA 142  K 4.2  CL 108  CO2 26  BUN 16  CREATININE 1.19*  GLUCOSE 112*  CALCIUM 10.0    Recent Labs  06/13/14 1154  INR 1.71*    Neurologically intact Neurovascular intact  Assessment/Plan: Bilateral LE pain- X-rays are reviewed and have not changed appearance in the past 5 years and her hips are not loose or infected as confirmed by the negative bone scan. Additionally, if it was a hip related problem, the pain would be groin and anterior thigh instead of buttocks and posterior thigh. She also states that she does better when up ambulating on a walker leaning forward. All of this points towards symptomatic spinal stenosis. Her MRI shows severe stenosis at multiple levels with near complete block at L4-5. This pathology would account for the pain that she is experiencing. I will discuss this with one of our spine surgeons and see what course of action would be best to pursue     Kemmerer V 06/15/2014, 7:26 PM

## 2014-06-15 NOTE — Progress Notes (Signed)
Patient Demographics  [name], is a 81 y.o. female, DOB - [birth year], XQJ:[id number]  Admit date - 06/13/2014   Admitting Physician [name] Maxon Rama, MD  Outpatient Primary MD for the patient is [name] Shelling, MD  LOS - 2   Chief Complaint  Patient presents with  . Hip Pain  . Nausea        Subjective:  [name] still complained about severe left hip pain, could be secondary to the previous hip replacement versus radiculopathy.   Assessment & Plan    Principal Problem:   Hip pain Active Problems:   GERD   Constipation   Essential hypertension   Atrial fibrillation   Hypothyroidism   Stage III chronic kidney disease  B/L Hip pain: - Findings on x-ray showed lucency at the site of the left hip hemiarthroplasty as well as broken encircling wires and lucency along the femoral stem with medial migration of the distal stem on the right. - The above mentioned findings were discussed with [name]. Maureen [name], these are chronic findings, and unlikely the cause of her symptoms. - Orthopedic surgery consulted.  - No relief from Norco at home, will switch to Percocet. - No evidence of infection, as afebrile, leukocytosis, bone scan with no evidence of infection. - MRI of lumbar spine showing severe lumbar scoliosis, with severe spinal stenosis at L4-L5, and severe left foraminal stenosis at L5-S1, discussed with neurosurgery [name], given patient severe scoliosis, and multilevel of joint disease, surgery won't be an option here.  Chronic kidney disease stage III - Creatinine is at baseline  GERD - Continue PPI therapy.   Constipation - on daily MiraLAX.   Essential hypertension - Continue metoprolol.   Atrial fibrillation - Continue metoprolol for rate control and Xarelto for anticoagulation. - Slight elevation of the INR is likely due to  Xarelto.   Hypothyroidism - Continue Synthroid.   Code Status: Full  Family Communication: Son at bedside  Disposition Plan: [facility] consult PT/OT   Procedures None   Consults   Orthopedic   Medications  Scheduled Meds: . calcium citrate-vitamin D  1 tablet Oral Daily  . levothyroxine  100 mcg Oral QAC breakfast  . metoprolol tartrate  25 mg Oral BID  . multivitamin with minerals  1 tablet Oral Daily  . pantoprazole  40 mg Oral Daily  . polyethylene glycol  17 g Oral Daily  . polyvinyl alcohol  1 drop Both Eyes BID  . Rivaroxaban  15 mg Oral Q supper  . vitamin B-12  1,000 mcg Oral Daily  . vitamin C  1,000 mg Oral Daily   Continuous Infusions:  PRN Meds:.acetaminophen **OR** acetaminophen, alum & mag hydroxide-simeth, bisacodyl, HYDROcodone-acetaminophen, morphine injection, ondansetron **OR** ondansetron (ZOFRAN) IV  DVT Prophylaxis on Xarelto  Lab Results  Component Value Date   PLT 198 06/13/2014    Antibiotics   Anti-infectives    None          Objective:   Filed Vitals:   06/14/14 1431 06/14/14 2054 06/15/14 0501 06/15/14 1500  BP: 124/93 142/58 143/72 149/76  Pulse: 67 68 56 60  Temp: 98.3 F (36.8 C) 97.5 F (36.4 C) 97.8 F (36.6 C) 97.7 F (36.5 C)  TempSrc: Oral Oral Oral Oral  Resp: 16 16 16 18   Height:      Weight:      SpO2: 98% 97% 97% 97%    Wt Readings from Last 3 Encounters:  06/13/14 72.7 kg (160 lb 4.4 oz)  11/19/13 73.029 kg (161 lb)  01/07/13 73.891 kg (162 lb 14.4 oz)     Intake/Output Summary (Last 24 hours) at 06/15/14 1529 Last data filed at 06/15/14 1300  Gross per 24 hour  Intake    480 ml  Output      0 ml  Net    480 ml     Physical Exam  Awake Alert, Oriented X 3, No new F.N deficits, Normal affect [name],PERRAL Supple Neck,No JVD, No cervical lymphadenopathy appriciated.  Symmetrical Chest wall movement, Good air movement bilaterally, CTAB RRR,No Gallops,Rubs or new Murmurs, No Parasternal  Heave +ve B.Sounds, Abd Soft, No tenderness, No organomegaly appriciated, No rebound - guarding or rigidity.  No Cyanosis, Clubbing or edema, No new Rash or bruise  , tender to palpation on lower back area, and bilateral hip areas.   Data Review   Micro Results No results found for this or any previous visit (from the past 240 hour(s)).  Radiology Reports Mr Lumbar Spine Wo Contrast  06/14/2014   CLINICAL DATA:  Low back pain and right leg pain.  EXAM: MRI LUMBAR SPINE WITHOUT CONTRAST  TECHNIQUE: Multiplanar, multisequence MR imaging of the lumbar spine was performed. No intravenous contrast was administered.  COMPARISON:  Abdomen radiograph dated 09/05/2005  FINDINGS: Conus tip is at L2-3. Fairly severe lumbar scoliosis with convexity to the left centered at L2-3, approximately 50 degrees. Paraspinal soft tissues are normal. Tiny stones in the otherwise normal appearing gallbladder. Small cysts in both kidneys.  T11-12: Normal.  T12-L1: Small broad-based disc bulge with hypertrophy of the ligamentum flavum and facet joints with narrowing of the left lateral recess which could affect the left T12 and L1 nerves. Fairly severe left foraminal stenosis.  L1-2: Broad-based disc bulge with accompanying osteophytes with hypertrophy of the right facet joint and ligamentum flavum with the right lateral recess stenosis and severe right foraminal stenosis. This could affect the right L1 and L2 nerves.  L2-3: Disc space narrowing with osteophytes narrowing the right lateral recess and right neural foramen severe right foraminal stenosis. Hypertrophy of the right facet joint and ligamentum flavum create the stenosis of the foramen. This could affect the right L2 nerve.  L3-4: Disc bulge and osteophytes create severe right foraminal stenosis. Hypertrophy of the ligamentum flavum facet joints Create moderately severe spinal stenosis and severe right lateral recess stenosis. The findings could affect the right L3 and  L4 nerves.  L4-5: Left lateral subluxation of L4 on L5 and disc space narrowing as well as hypertrophy of the facet joints combine to create very severe spinal stenosis with almost complete occlusion of the spinal canal. The left superior facet of L5 occludes most of the spinal canal. Small broad-based disc protrusion and accompanying osteophytes occlude most of the rest of the canal. Severe right and left foraminal stenosis.  L5-S1: 5 mm retrolisthesis of L5 on S1. Marked hypertrophy of the left facet joint and ligamentum flavum severely compress the thecal sac and left lateral recess. Severe left foraminal stenosis. The left S1 nerve is also compressed.  IMPRESSION: 1. Severe spinal stenosis at L4-5 with almost complete obliteration of the spinal canal. 2. Severe left foraminal stenosis and lateral recess stenosis at L5-S1. 3. Diffuse degenerative disc and joint disease  throughout the lumbar spine create neural impingement at each level as described above.   Electronically Signed   By: Lorriane Shire M.D.   On: 06/14/2014 07:42   Nm Bone Scan 3 Phase  06/14/2014   CLINICAL DATA:  Bilateral hip pain. Right hip replacement 10 years prior. Left hip replacement 30 years prior.  EXAM: NUCLEAR MEDICINE 3-PHASE BONE SCAN  TECHNIQUE: Radionuclide angiographic images, immediate static blood pool images, and 3-hour delayed static images were obtained of the hips after intravenous injection of radiopharmaceutical.  RADIOPHARMACEUTICALS:  24.2 mCi Technetium-62m MDP IV  COMPARISON:  None.  FINDINGS: Vascular phase: No asymmetric or increased blood flow to the left or right hip.  Blood pool phase: No asymmetric or increased blood pool activity.  Delayed phase: There is no significant abnormal radiotracer uptake to suggest loosening of the left or right prosthetic. There is uptake along the medial aspect of the proximal right femur which correlates with heterotopic ossification on plain film.  IMPRESSION: No evidence of  loosening or infection of left right hip prosthetic.   Electronically Signed   By: Suzy Bouchard M.D.   On: 06/14/2014 15:10    CBC  Recent Labs Lab 06/13/14 1154  WBC 5.7  HGB 10.5*  HCT 31.9*  PLT 198  MCV 96.4  MCH 31.7  MCHC 32.9  RDW 13.5  LYMPHSABS 1.1  MONOABS 0.4  EOSABS 0.1  BASOSABS 0.0    Chemistries   Recent Labs Lab 06/13/14 1154  NA 142  K 4.2  CL 108  CO2 26  GLUCOSE 112*  BUN 16  CREATININE 1.19*  CALCIUM 10.0   ------------------------------------------------------------------------------------------------------------------ estimated creatinine clearance is 30.5 mL/min (by C-G formula based on Cr of 1.19). ------------------------------------------------------------------------------------------------------------------ No results for input(s): HGBA1C in the last 72 hours. ------------------------------------------------------------------------------------------------------------------ No results for input(s): CHOL, HDL, LDLCALC, TRIG, CHOLHDL, LDLDIRECT in the last 72 hours. ------------------------------------------------------------------------------------------------------------------ No results for input(s): TSH, T4TOTAL, T3FREE, THYROIDAB in the last 72 hours.  Invalid input(s): FREET3 ------------------------------------------------------------------------------------------------------------------ No results for input(s): VITAMINB12, FOLATE, FERRITIN, TIBC, IRON, RETICCTPCT in the last 72 hours.  Coagulation profile  Recent Labs Lab 06/13/14 1154  INR 1.71*    No results for input(s): DDIMER in the last 72 hours.  Cardiac Enzymes No results for input(s): CKMB, TROPONINI, MYOGLOBIN in the last 168 hours.  Invalid input(s): CK ------------------------------------------------------------------------------------------------------------------ Invalid input(s): POCBNP     Time Spent in minutes   30 minutes   Midori Dado A  M.D on 06/15/2014 at 3:29 PM  Between 7am to 7pm - Pager - [phone]  After 7pm go to [email or web address] - password [email or web address]  And look for the night coverage person covering for me after hours  [facility] Office  [phone]   **Disclaimer: This note may have been dictated with voice recognition software. Similar sounding words can inadvertently be transcribed and this note may contain transcription errors which may not have been corrected upon publication of note.**

## 2014-06-15 NOTE — Evaluation (Signed)
Occupational Therapy One Time Evaluation Patient Details Name: [name] MRN: [id number] DOB: 1923-08-04 Today's Date: 06/15/2014    History of Present Illness Pt is an 81 year old female admitted with bilateral hip pain with hx of R THA and L hip hemiarthroplasty.  MRI of lumbar spine showing severe lumbar scoliosis, with severe spinal stenosis at L4-L5, and severe left foraminal stenosis at L5-S1   Clinical Impression   Pt moving well to perform ADL this am. She is overall at supervision level and min guard for shower transfer. She has good family support that will check in on her frequently at d/c. Advised to have supervision for initial showers however for safety and pt verbalized her daughter can come and assist with this. No further acute OT needs. No pain reported during session. Will sign off.    Follow Up Recommendations  No OT follow up;Supervision - Intermittent    Equipment Recommendations  None recommended by OT    Recommendations for Other Services       Precautions / Restrictions Precautions Precautions: Back Precaution Comments: encouraged back precautions       Mobility Bed Mobility               General bed mobility comments: pt up in recliner on arrival  Transfers Overall transfer level: Needs assistance Equipment used: Rolling walker (2 wheeled) Transfers: Sit to/from Stand Sit to Stand: Supervision         General transfer comment: verbal cues for hand placement    Balance Overall balance assessment:  (pt reports no hx of falls)                                          ADL Overall ADL's : Needs assistance/impaired Eating/Feeding: Independent;Sitting   Grooming: Wash/dry hands;Supervision/safety;Standing   Upper Body Bathing: Set up;Sitting   Lower Body Bathing: Supervison/ safety;Sit to/from stand   Upper Body Dressing : Set up;Sitting   Lower Body Dressing: Supervision/safety;Sit to/from stand   Toilet  Transfer: Supervision/safety;Ambulation;Comfort height toilet;Grab bars;RW   Toileting- Clothing Manipulation and Hygiene: Supervision/safety;Sit to/from stand   Tub/ Shower Transfer: Walk-in shower;Min Web designer ADL Comments: Pt reports no pain with ADL this am. educated on safety with ADL including having daughter with her for initial showers for safety and using walk in shower rather than getting down into tub. Also discussed use of shower seat to make showering safer and she does have a grab bar in the shower. Pt able to cross LEs up to her to don socks and not have to bed over. Son present for session. Pt states her commode is similar to height here and she has a vanity beside commode to use with transitions on and off commode. Encouraged pt to accept help from neighbors and family initially for meals, laundry, household tasks, etc.      Estate agent      Pertinent Vitals/Pain Pain Assessment: No/denies pain (premedicated, no increased in pain with mobility)     Hand Dominance     Extremity/Trunk Assessment Upper Extremity Assessment Upper Extremity Assessment: Overall WFL for tasks assessed          Communication Communication Communication: No difficulties   Cognition Arousal/Alertness: Awake/alert Behavior During Therapy: WFL for tasks assessed/performed Overall Cognitive Status: Within Functional Limits for tasks  assessed                     General Comments       Exercises       Shoulder Instructions      Home Living Family/patient expects to be discharged to:: Private residence Living Arrangements: Alone Available Help at Discharge: Family Type of Home: House Home Access: Stairs to enter [employer] of Steps: 1   Home Layout: One level     Bathroom Shower/Tub: Walk-in shower;Tub/shower unit   Bathroom Toilet: Standard     Home Equipment: Cane - single point;Walker - 2 wheels;Shower  seat;Grab bars - tub/shower          Prior Functioning/Environment Level of Independence: Independent with assistive device(s)        Comments: typically uses SPC    OT Diagnosis: Generalized weakness   OT Problem List:     OT Treatment/Interventions:      OT Goals(Current goals can be found in the care plan section) Acute Rehab OT Goals Patient Stated Goal: home OT Goal Formulation: With patient  OT Frequency:     Barriers to D/C:            Co-evaluation              End of Session Equipment Utilized During Treatment: Rolling walker  Activity Tolerance: Patient tolerated treatment well Patient left: in chair;with call bell/phone within reach;with family/visitor present   Time: 1040-1102 OT Time Calculation (min): 22 min Charges:  OT General Charges $OT Visit: 1 Procedure OT Evaluation $Initial OT Evaluation Tier I: 1 Procedure G-Codes:    Jules Schick  [phone] 06/15/2014, 11:13 AM

## 2014-06-16 LAB — BASIC METABOLIC PANEL
Anion gap: 7 (ref 5–15)
BUN: 18 mg/dL (ref 6–23)
CO2: 27 mmol/L (ref 19–32)
Calcium: 9.2 mg/dL (ref 8.4–10.5)
Chloride: 107 mmol/L (ref 96–112)
Creatinine, Ser: 1.44 mg/dL — ABNORMAL HIGH (ref 0.50–1.10)
GFR calc Af Amer: 36 mL/min — ABNORMAL LOW (ref >90)
GFR calc non Af Amer: 31 mL/min — ABNORMAL LOW (ref >90)
Glucose, Bld: 111 mg/dL — ABNORMAL HIGH (ref 70–99)
POTASSIUM: 4.6 mmol/L (ref 3.5–5.1)
SODIUM: 141 mmol/L (ref 135–145)

## 2014-06-16 MED ORDER — PREDNISONE 10 MG (21) PO TBPK: 10.0000 mg | ORAL_TABLET | Freq: Three times a day (TID) | ORAL | Status: DC

## 2014-06-16 MED ORDER — PREDNISONE 10 MG (21) PO TBPK: ORAL_TABLET | ORAL | Status: DC

## 2014-06-16 MED ORDER — PREDNISONE 10 MG (21) PO TBPK
20.0000 mg | ORAL_TABLET | Freq: Every morning | ORAL | Status: AC
  Administered 2014-06-16: 20 mg via ORAL
  Filled 2014-06-16: qty 21

## 2014-06-16 MED ORDER — PREDNISONE 10 MG (21) PO TBPK: 20.0000 mg | ORAL_TABLET | Freq: Every evening | ORAL | Status: DC

## 2014-06-16 MED ORDER — PREDNISONE 10 MG (21) PO TBPK
10.0000 mg | ORAL_TABLET | ORAL | Status: AC
  Administered 2014-06-16: 10 mg via ORAL

## 2014-06-16 MED ORDER — OXYCODONE HCL 5 MG PO TABS: 5.0000 mg | ORAL_TABLET | Freq: Four times a day (QID) | ORAL | Status: DC | PRN

## 2014-06-16 MED ORDER — OXYCODONE HCL 5 MG PO TABS
5.0000 mg | ORAL_TABLET | Freq: Four times a day (QID) | ORAL | Status: DC | PRN
  Administered 2014-06-16: 10 mg via ORAL
  Administered 2014-06-16: 5 mg via ORAL
  Filled 2014-06-16: qty 2
  Filled 2014-06-16: qty 1

## 2014-06-16 MED ORDER — OXYCODONE-ACETAMINOPHEN 5-325 MG PO TABS: 1.0000 | ORAL_TABLET | Freq: Four times a day (QID) | ORAL | Status: DC | PRN

## 2014-06-16 MED ORDER — PREDNISONE 10 MG (21) PO TBPK: 10.0000 mg | ORAL_TABLET | Freq: Four times a day (QID) | ORAL | Status: DC

## 2014-06-16 MED ORDER — CALCIUM CITRATE-VITAMIN D 500-400 MG-UNIT PO CHEW
1.0000 | CHEWABLE_TABLET | Freq: Every day | ORAL | Status: DC
  Administered 2014-06-16: 1 via ORAL
  Filled 2014-06-16: qty 1

## 2014-06-16 NOTE — Consult Note (Signed)
Reason for Consult:Back and leg pain. Referring Physician: Dr. Prudence [name] is an 81 y.o. female.  HPI: Progressive back and Leg pain over the past ten days.Her history was reviewed with her daughter.   Past Medical History  Diagnosis Date  . GERD (gastroesophageal reflux disease)   . Osteoporosis     fosamax 10 yrs, reclast 3 yrs  . Hypothyroidism   . Scoliosis   . Varicose veins   . Hypertension   . Hx of blood clots 60's    eye rt  . Allergic cough   . Heart murmur   . Peripheral vascular disease     blood clot behind rt eye   . Arthritis   . Thyroid nodule   . History of PSVT (paroxysmal supraventricular tachycardia)   . Primary hyperparathyroidism 2012  . Chronic atrial fibrillation 12/13    normal EF, severe tricuspid reguritation  . PVC's (premature ventricular contractions)     history of  . Facial neuralgia     atypical  . Hyperplastic colon polyp   . Macular degeneration of left eye   . Bronchitis, chronic with acute exacerbation 04/25/2011  . Stage III chronic kidney disease     Past Surgical History  Procedure Laterality Date  . Total hip arthroplasty  1981,1982,2001,2007,    Right and Left  . Reverse shoulder arthroplasty Right 11/201/2014    Dr Onnie Graham  . Reverse shoulder arthroplasty Right 01/07/2013    Procedure: RIGHT REVERSE SHOULDER ARTHROPLASTY;  Surgeon: Marin Shutter, MD;  Location: [place];  Service: Orthopedics;  Laterality: Right;  . Cataract extraction, bilateral      Family History  Problem Relation Age of Onset  . Breast cancer Daughter   . Heart attack Brother   . Stroke Mother   . Rheum arthritis Father   . Lung cancer Father   . Lung cancer Sister     Social History:  reports that she has never smoked. She has never used smokeless tobacco. She reports that she does not drink alcohol or use illicit drugs.  Allergies:  Allergies  Allergen Reactions  . Amlodipine Besylate Swelling  . Lodine [Etodolac] Hives  .  Naprosyn [Naproxen] Hives  . Penicillins Hives    REACTION: hives  . Pneumovax [Pneumococcal Polysaccharide Vaccine]     Whelps   . Robitussin Dm [Dextromethorphan-Guaifenesin] Hives  . Sulfonamide Derivatives Hives    REACTION: hives    Medications: I have reviewed the patient's current medications.  Results for orders placed or performed during the hospital encounter of 06/13/14 (from the past 48 hour(s))  Basic metabolic panel     Status: Abnormal   Collection Time: 06/16/14  4:23 AM  Result Value Ref Range   Sodium 141 135 - 145 mmol/L   Potassium 4.6 3.5 - 5.1 mmol/L   Chloride 107 96 - 112 mmol/L   CO2 27 19 - 32 mmol/L   Glucose, Bld 111 (H) 70 - 99 mg/dL   BUN 18 6 - 23 mg/dL   Creatinine, Ser 1.44 (H) 0.50 - 1.10 mg/dL   Calcium 9.2 8.4 - 10.5 mg/dL   GFR calc non Af Amer 31 (L) >90 mL/min   GFR calc Af Amer 36 (L) >90 mL/min    Comment: (NOTE) The eGFR has been calculated using the CKD EPI equation. This calculation has not been validated in all clinical situations. eGFR's persistently <90 mL/min signify possible Chronic Kidney Disease.    Anion gap 7 5 - 15  Nm Bone Scan 3 Phase  06/14/2014   CLINICAL DATA:  Bilateral hip pain. Right hip replacement 10 years prior. Left hip replacement 30 years prior.  EXAM: NUCLEAR MEDICINE 3-PHASE BONE SCAN  TECHNIQUE: Radionuclide angiographic images, immediate static blood pool images, and 3-hour delayed static images were obtained of the hips after intravenous injection of radiopharmaceutical.  RADIOPHARMACEUTICALS:  24.2 mCi Technetium-47mMDP IV  COMPARISON:  None.  FINDINGS: Vascular phase: No asymmetric or increased blood flow to the left or right hip.  Blood pool phase: No asymmetric or increased blood pool activity.  Delayed phase: There is no significant abnormal radiotracer uptake to suggest loosening of the left or right prosthetic. There is uptake along the medial aspect of the proximal right femur which correlates  with heterotopic ossification on plain film.  IMPRESSION: No evidence of loosening or infection of left right hip prosthetic.   Electronically Signed   By: SSuzy BouchardM.D.   On: 06/14/2014 15:10    Review of Systems  Constitutional: Negative.   HENT: Negative.   Eyes: Negative.   Cardiovascular: Negative.   Gastrointestinal: Negative.   Genitourinary: Negative.   Musculoskeletal: Positive for back pain and joint pain.  Skin: Negative.   Neurological: Positive for focal weakness.  Endo/Heme/Allergies: Negative.   Psychiatric/Behavioral: Negative.    Blood pressure 154/72, pulse 65, temperature 98.1 F (36.7 C), temperature source Oral, resp. rate 16, height '5\' 2"'  (1.575 m), weight 72.7 kg (160 lb 4.4 oz), SpO2 98 %. Physical Exam  Constitutional: She appears well-developed.  HENT:  Head: Normocephalic.  Eyes: Pupils are equal, round, and reactive to light.  Neck: Normal range of motion.  Cardiovascular: Normal rate.   Respiratory: Effort normal.  Musculoskeletal:  Weakness of her Left foot dorsiflexors,  Neurological:  Weakness of her left foot dorsiflexors.  Skin: Skin is warm.    Assessment/Plan: MRI reveals a severe spinal Stenosis at L-4-L-5 level. We need to discuss this with her cardiologist in order to prepare her for a Lumbar Myelogram if she doesn't respond to the Prednisone. Her daughter will bring her to the office next week. She has a partial foot drop on mthe left.  [name] A 06/16/2014, 7:41 AM

## 2014-06-16 NOTE — Discharge Summary (Signed)
Physician Discharge Summary  [name] VQM:[id number] DOB: December 22, 1923 DOA: 06/13/2014  PCP: Irven Shelling, MD  Admit date: 06/13/2014 Discharge date: 06/16/2014  Time spent: 40 minutes  Recommendations for Outpatient Follow-up:  1. Follow-up with Dr. Gladstone Lighter in 1 week. 2. If intra-articular steroids considered, please hold Xarelto for at least one day prior to procedure.  Discharge Diagnoses:  Principal Problem:   Hip pain Active Problems:   GERD   Constipation   Essential hypertension   Atrial fibrillation   Hypothyroidism   Stage III chronic kidney disease   Discharge Condition: Stable  Diet recommendation: Heart healthy  Filed Weights   06/13/14 1050 06/13/14 1750  Weight: 72.122 kg (159 lb) 72.7 kg (160 lb 4.4 oz)    History of present illness:  [name] is an 81 y.o. female with a PMH of osteoarthritis status post hemiarthroplasty of the left hip and right total hip arthroplasty who presents with a several month history of left-sided hip pain, then over the past 3 days, she has developed right sided hip pain. She has seen Dr. Maureen Ralphs twice with the pain, treated with hydrocodone, now cannot stand or sit because of pain. Her family reports that she could not put on her clothes today. She took 2 hydrocodone yesterday, but it didn't help the pain. She says she does not like to take pain medications due to the fact that it causes constpation and she has a fear of addiciton. Pain is 9/10 described as a "dull, continuous ache". The patient has not had any back pain, lower extremity numbness or paresthesias. No recent falls or injuries. She was given morphine in the ED, which has eased her pain.  Hospital Course:   Bilateral severe hip pain: - Findings on x-ray showed lucency at the site of the left hip hemiarthroplasty as well as broken encircling wires and lucency along the femoral stem with medial migration of the distal stem on the right. - The above  mentioned findings were discussed with Dr. Maureen Ralphs, these are chronic findings, and unlikely the cause of her symptoms. - Orthopedic surgery consulted.  - No relief from Norco at home, will switch to Percocet. - No evidence of infection, as afebrile, leukocytosis, bone scan with no evidence of infection. - MRI of lumbar spine showing severe lumbar scoliosis, with severe spinal stenosis at L4-L5, and severe left foraminal stenosis at L5-S1, given patient severe scoliosis, and multilevel of joint disease, surgery won't be an option here. - Bone scan showed no evidence of loosening or infection, the left hip pain is likely is from radiculopathy. - Seen by orthospine Dr. Gladstone Lighter and recommended oxycodone and prednisone, follow-up as outpatient for further evaluation. - Pain is more controlled with oxycodone at the time of discharge.  Chronic kidney disease stage III - Creatinine is at baseline, slight bump to 1.4 from 1.2, follow creatinine infrequently as outpatient.  GERD - Continue PPI therapy.   Constipation - on daily MiraLAX.   Essential hypertension - Continue metoprolol.   Atrial fibrillation - Continue metoprolol for rate control and Xarelto for anticoagulation. - Slight elevation of the INR is likely due to Xarelto.   Hypothyroidism - Continue Synthroid.  Procedures:  None  Consultations: None  Discharge Exam: Filed Vitals:   06/16/14 0432  BP: 154/72  Pulse: 65  Temp: 98.1 F (36.7 C)  Resp: 16   General: Alert and awake, oriented x3, not in any acute distress. HEENT: anicteric sclera, pupils reactive to light and accommodation, EOMI  CVS: S1-S2 clear, no murmur rubs or gallops Chest: clear to auscultation bilaterally, no wheezing, rales or rhonchi Abdomen: soft nontender, nondistended, normal bowel sounds, no organomegaly Extremities: no cyanosis, clubbing or edema noted bilaterally Neuro: Cranial nerves II-XII intact, no focal neurological  deficits  Discharge Instructions   Discharge Instructions    Diet - low sodium heart healthy    Complete by:  As directed      Increase activity slowly    Complete by:  As directed           Current Discharge Medication List    START taking these medications   Details  oxyCODONE (OXY IR/ROXICODONE) 5 MG immediate release tablet Take 1-2 tablets (5-10 mg total) by mouth every 6 (six) hours as needed for moderate pain or severe pain. Qty: 80 tablet, Refills: 0    predniSONE (STERAPRED UNI-PAK 21 TAB) 10 MG (21) TBPK tablet Prednisone Sterapred Dosepack 10 mg (21 tabs)  Take As Directed Until Dosepack Completed  Blister Card to be given to patient at time of discharge from the hopsital Qty: 21 tablet, Refills: 0      CONTINUE these medications which have NOT CHANGED   Details  Ascorbic Acid (VITAMIN C) 1000 MG tablet Take 1,000 mg by mouth daily.     bisacodyl (DULCOLAX) 10 MG suppository Place 10 mg rectally daily as needed for mild constipation or moderate constipation.    calcium citrate-vitamin D (CITRACAL+D) 315-200 MG-UNIT per tablet Take 1 tablet by mouth daily.     Cyanocobalamin (B-12) 1000 MCG TBCR Take 1 tablet by mouth daily.     ergocalciferol (VITAMIN D2) 50000 UNITS capsule Take 50,000 Units by mouth every 30 (thirty) days.     levothyroxine (SYNTHROID, LEVOTHROID) 100 MCG tablet Take 100 mcg by mouth daily.     metoprolol tartrate (LOPRESSOR) 25 MG tablet Take 1 tablet (25 mg total) by mouth 2 (two) times daily. Qty: 180 tablet, Refills: 6   Associated Diagnoses: Murmur; SOB (shortness of breath)    Multiple Vitamin (MULTIVITAMIN) tablet Take 1 tablet by mouth daily.      NON FORMULARY Take 1 tablet by mouth 2 (two) times daily. Macular Degeneration Supp. Take 1 tab by mouth twice daily.    omeprazole (PRILOSEC) 20 MG capsule Take 20 mg by mouth every other day.     Polyvinyl Alcohol-Povidone (REFRESH OP) Apply 1 drop to eye 2 (two) times daily.     XARELTO 15 MG TABS tablet Take 1 tablet by mouth daily.      STOP taking these medications     HYDROcodone-acetaminophen (NORCO/VICODIN) 5-325 MG per tablet        Allergies  Allergen Reactions  . Amlodipine Besylate Swelling  . Lodine [Etodolac] Hives  . Naprosyn [Naproxen] Hives  . Penicillins Hives    REACTION: hives  . Pneumovax [Pneumococcal Polysaccharide Vaccine]     Whelps   . Robitussin Dm [Dextromethorphan-Guaifenesin] Hives  . Sulfonamide Derivatives Hives    REACTION: hives   Follow-up Information    Follow up with GIOFFRE,RONALD A, MD. Schedule an appointment as soon as possible for a visit in 1 week.   Specialty:  Orthopedic Surgery   Why:  Call office at [phone] ASAP to setup follow up appointment with Dr. Lindwood Qua next week.   Contact information:   [address] [place] [address] [phone]        The results of significant diagnostics from this hospitalization (including imaging, microbiology, ancillary and  laboratory) are listed below for reference.    Significant Diagnostic Studies: Mr Lumbar Spine Wo Contrast  06/14/2014   CLINICAL DATA:  Low back pain and right leg pain.  EXAM: MRI LUMBAR SPINE WITHOUT CONTRAST  TECHNIQUE: Multiplanar, multisequence MR imaging of the lumbar spine was performed. No intravenous contrast was administered.  COMPARISON:  Abdomen radiograph dated 09/05/2005  FINDINGS: Conus tip is at L2-3. Fairly severe lumbar scoliosis with convexity to the left centered at L2-3, approximately 50 degrees. Paraspinal soft tissues are normal. Tiny stones in the otherwise normal appearing gallbladder. Small cysts in both kidneys.  T11-12: Normal.  T12-L1: Small broad-based disc bulge with hypertrophy of the ligamentum flavum and facet joints with narrowing of the left lateral recess which could affect the left T12 and L1 nerves. Fairly severe left foraminal stenosis.  L1-2: Broad-based disc bulge with accompanying  osteophytes with hypertrophy of the right facet joint and ligamentum flavum with the right lateral recess stenosis and severe right foraminal stenosis. This could affect the right L1 and L2 nerves.  L2-3: Disc space narrowing with osteophytes narrowing the right lateral recess and right neural foramen severe right foraminal stenosis. Hypertrophy of the right facet joint and ligamentum flavum create the stenosis of the foramen. This could affect the right L2 nerve.  L3-4: Disc bulge and osteophytes create severe right foraminal stenosis. Hypertrophy of the ligamentum flavum facet joints Create moderately severe spinal stenosis and severe right lateral recess stenosis. The findings could affect the right L3 and L4 nerves.  L4-5: Left lateral subluxation of L4 on L5 and disc space narrowing as well as hypertrophy of the facet joints combine to create very severe spinal stenosis with almost complete occlusion of the spinal canal. The left superior facet of L5 occludes most of the spinal canal. Small broad-based disc protrusion and accompanying osteophytes occlude most of the rest of the canal. Severe right and left foraminal stenosis.  L5-S1: 5 mm retrolisthesis of L5 on S1. Marked hypertrophy of the left facet joint and ligamentum flavum severely compress the thecal sac and left lateral recess. Severe left foraminal stenosis. The left S1 nerve is also compressed.  IMPRESSION: 1. Severe spinal stenosis at L4-5 with almost complete obliteration of the spinal canal. 2. Severe left foraminal stenosis and lateral recess stenosis at L5-S1. 3. Diffuse degenerative disc and joint disease throughout the lumbar spine create neural impingement at each level as described above.   Electronically Signed   By: Lorriane Shire M.D.   On: 06/14/2014 07:42   Nm Bone Scan 3 Phase  06/14/2014   CLINICAL DATA:  Bilateral hip pain. Right hip replacement 10 years prior. Left hip replacement 30 years prior.  EXAM: NUCLEAR MEDICINE 3-PHASE  BONE SCAN  TECHNIQUE: Radionuclide angiographic images, immediate static blood pool images, and 3-hour delayed static images were obtained of the hips after intravenous injection of radiopharmaceutical.  RADIOPHARMACEUTICALS:  24.2 mCi Technetium-43m MDP IV  COMPARISON:  None.  FINDINGS: Vascular phase: No asymmetric or increased blood flow to the left or right hip.  Blood pool phase: No asymmetric or increased blood pool activity.  Delayed phase: There is no significant abnormal radiotracer uptake to suggest loosening of the left or right prosthetic. There is uptake along the medial aspect of the proximal right femur which correlates with heterotopic ossification on plain film.  IMPRESSION: No evidence of loosening or infection of left right hip prosthetic.   Electronically Signed   By: Suzy Bouchard M.D.   On: 06/14/2014  15:10   Dg Hips Bilat With Pelvis 3-4 Views  06/13/2014   CLINICAL DATA:  Increasing bilateral hip pain. No new trauma. Chronic hip pain with worsening symptoms.  EXAM: BILATERAL HIP (WITH PELVIS) 3-4 VIEWS  COMPARISON:  None.  FINDINGS: LEFT hip hemiarthroplasty is present. Mild lucency is present along the bone-cement interface in the proximal femur. This suggests the possibility of early loosening.  The visible obturator rings appear within normal limits.  Complicated RIGHT total hip arthroplasty is present with encircling cables, cement augmentation and dystrophic bone. Encircling cables are fractured. There appears to be medial migration of the distal stem with cortical thickening and remodeling along the distal medial femoral shaft. No periprosthetic fracture is identified. Lucency is present along the femoral stem suggesting loosening. Acetabular cup appears intact. Partially visible lumbar spondylosis.  IMPRESSION: 1. No acute osseous abnormality. 2. LEFT hip hemiarthroplasty with lucency along the bone-cement interface suggesting early loosening. 3. Complicated RIGHT total hip  arthroplasty. Broken encircling cables and lucency along the femoral stem with medial migration of the distal stem.   Electronically Signed   By: Dereck Ligas M.D.   On: 06/13/2014 11:51    Microbiology: No results found for this or any previous visit (from the past 240 hour(s)).   Labs: Basic Metabolic Panel:  Recent Labs Lab 06/13/14 1154 06/16/14 0423  NA 142 141  K 4.2 4.6  CL 108 107  CO2 26 27  GLUCOSE 112* 111*  BUN 16 18  CREATININE 1.19* 1.44*  CALCIUM 10.0 9.2   Liver Function Tests: No results for input(s): AST, ALT, ALKPHOS, BILITOT, PROT, ALBUMIN in the last 168 hours. No results for input(s): LIPASE, AMYLASE in the last 168 hours. No results for input(s): AMMONIA in the last 168 hours. CBC:  Recent Labs Lab 06/13/14 1154  WBC 5.7  NEUTROABS 4.2  HGB 10.5*  HCT 31.9*  MCV 96.4  PLT 198   Cardiac Enzymes: No results for input(s): CKTOTAL, CKMB, CKMBINDEX, TROPONINI in the last 168 hours. BNP: BNP (last 3 results) No results for input(s): BNP in the last 8760 hours.  ProBNP (last 3 results) No results for input(s): PROBNP in the last 8760 hours.  CBG: No results for input(s): GLUCAP in the last 168 hours.     Signed:  Berkley Wrightsman A  [facility] 06/16/2014, 2:51 PM

## 2014-06-16 NOTE — Discharge Instructions (Signed)
Lumbosacral Radiculopathy Lumbosacral radiculopathy is a pinched nerve or nerves in the low back (lumbosacral area). When this happens you may have weakness in your legs and may not be able to stand on your toes. You may have pain going down into your legs. There may be difficulties with walking normally. There are many causes of this problem. Sometimes this may happen from an injury, or simply from arthritis or boney problems. It may also be caused by other illnesses such as diabetes. If there is no improvement after treatment, further studies may be done to find the exact cause. DIAGNOSIS  X-rays may be needed if the problems become long standing. Electromyograms may be done. This study is one in which the working of nerves and muscles is studied. HOME CARE INSTRUCTIONS   Applications of ice packs may be helpful. Ice can be used in a plastic bag with a towel around it to prevent frostbite to skin. This may be used every 2 hours for 20 to 30 minutes, or as needed, while awake, or as directed by your caregiver.  Only take over-the-counter or prescription medicines for pain, discomfort, or fever as directed by your caregiver.  If physical therapy was prescribed, follow your caregiver's directions. SEEK IMMEDIATE MEDICAL CARE IF:   You have pain not controlled with medications.  You seem to be getting worse rather than better.  You develop increasing weakness in your legs.  You develop loss of bowel or bladder control.  You have difficulty with walking or balance, or develop clumsiness in the use of your legs.  You have a fever. MAKE SURE YOU:   Understand these instructions.  Will watch your condition.  Will get help right away if you are not doing well or get worse. Document Released: 02/04/2005 Document Revised: 04/29/2011 Document Reviewed: 09/25/2007 [facility] Patient Information 2015 [place], [place]. This information is not intended to replace advice given to you by your health  care provider. Make sure you discuss any questions you have with your health care provider.  Spinal Stenosis    Spinal stenosis is an abnormal narrowing of the canals of your spine (vertebrae).  CAUSES  Spinal stenosis is caused by areas of bone pushing into the central canals of your vertebrae. This condition can be present at birth (congenital). It also may be caused by arthritic deterioration of your vertebrae (spinal degeneration).  SYMPTOMS  Pain that is generally worse with activities, particularly standing and walking.  Numbness, tingling, hot or cold sensations, weakness, or weariness in your legs.  Frequent episodes of falling.  A foot-slapping gait that leads to muscle weakness. DIAGNOSIS  Spinal stenosis is diagnosed with the use of magnetic resonance imaging (MRI) or computed tomography (CT).  TREATMENT  Initial therapy for spinal stenosis focuses on the management of the pain and other symptoms associated with the condition. These therapies include:  Practicing postural changes to lessen pressure on your nerves.  Exercises to strengthen the core of your body.  Loss of excess body weight.  The use of nonsteroidal anti-inflammatory medicines to reduce swelling and inflammation in your nerves. When therapies to manage pain are not successful, surgery to treat spinal stenosis may be recommended. This surgery involves removing excess bone, which puts pressure on your nerve roots. During this surgery (laminectomy), the posterior boney arch (lamina) and excess bone around the facet joints are removed.  Document Released: 04/27/2003 Document Revised: 06/21/2013 Document Reviewed: 05/15/2012  [facility] Patient Information 2015 [place], [place]. This information is not intended to  replace advice given to you by your health care provider. Make sure you discuss any questions you have with your health care provider.

## 2014-06-24 DIAGNOSIS — M5136 Other intervertebral disc degeneration, lumbar region: Secondary | ICD-10-CM | POA: Diagnosis not present

## 2014-06-24 DIAGNOSIS — M4807 Spinal stenosis, lumbosacral region: Secondary | ICD-10-CM | POA: Diagnosis not present

## 2014-06-27 ENCOUNTER — Other Ambulatory Visit: Payer: Self-pay | Admitting: Orthopedic Surgery

## 2014-06-27 DIAGNOSIS — M48061 Spinal stenosis, lumbar region without neurogenic claudication: Secondary | ICD-10-CM

## 2014-06-29 ENCOUNTER — Encounter: Payer: Self-pay | Admitting: Cardiology

## 2014-06-29 ENCOUNTER — Ambulatory Visit (INDEPENDENT_AMBULATORY_CARE_PROVIDER_SITE_OTHER): Payer: Medicare Other | Admitting: Cardiology

## 2014-06-29 VITALS — BP 138/80 | HR 63 | Ht 62.0 in | Wt 160.0 lb

## 2014-06-29 DIAGNOSIS — I482 Chronic atrial fibrillation, unspecified: Secondary | ICD-10-CM

## 2014-06-29 DIAGNOSIS — R238 Other skin changes: Secondary | ICD-10-CM

## 2014-06-29 DIAGNOSIS — Z7901 Long term (current) use of anticoagulants: Secondary | ICD-10-CM

## 2014-06-29 DIAGNOSIS — I10 Essential (primary) hypertension: Secondary | ICD-10-CM | POA: Diagnosis not present

## 2014-06-29 DIAGNOSIS — Z5181 Encounter for therapeutic drug level monitoring: Secondary | ICD-10-CM

## 2014-06-29 DIAGNOSIS — R233 Spontaneous ecchymoses: Secondary | ICD-10-CM

## 2014-06-29 DIAGNOSIS — I071 Rheumatic tricuspid insufficiency: Secondary | ICD-10-CM

## 2014-06-29 DIAGNOSIS — I272 Pulmonary hypertension, unspecified: Secondary | ICD-10-CM

## 2014-06-29 DIAGNOSIS — I351 Nonrheumatic aortic (valve) insufficiency: Secondary | ICD-10-CM

## 2014-06-29 DIAGNOSIS — I270 Primary pulmonary hypertension: Secondary | ICD-10-CM

## 2014-06-29 MED ORDER — APIXABAN 2.5 MG PO TABS: 2.5000 mg | ORAL_TABLET | Freq: Two times a day (BID) | ORAL | Status: DC

## 2014-06-29 NOTE — Progress Notes (Signed)
Patient ID: [name], female   DOB: Dec 04, 1923, 81 y.o.   MRN: [id number]    Patient Name: [name] Date of Encounter: 06/29/2014  Primary Care Provider:  Irven Shelling, MD Primary Cardiologist: Dorothy Spark  Problem List   Past Medical History  Diagnosis Date  . GERD (gastroesophageal reflux disease)   . Osteoporosis     fosamax 10 yrs, reclast 3 yrs  . Hypothyroidism   . Scoliosis   . Varicose veins   . Hypertension   . Hx of blood clots 60's    eye rt  . Allergic cough   . Heart murmur   . Peripheral vascular disease     blood clot behind rt eye   . Arthritis   . Thyroid nodule   . History of PSVT (paroxysmal supraventricular tachycardia)   . Primary hyperparathyroidism 2012  . Chronic atrial fibrillation 12/13    normal EF, severe tricuspid reguritation  . PVC's (premature ventricular contractions)     history of  . Facial neuralgia     atypical  . Hyperplastic colon polyp   . Macular degeneration of left eye   . Bronchitis, chronic with acute exacerbation 04/25/2011  . Stage III chronic kidney disease    Past Surgical History  Procedure Laterality Date  . Total hip arthroplasty  1981,1982,2001,2007,    Right and Left  . Reverse shoulder arthroplasty Right 11/201/2014    Dr Onnie Graham  . Reverse shoulder arthroplasty Right 01/07/2013    Procedure: RIGHT REVERSE SHOULDER ARTHROPLASTY;  Surgeon: Marin Shutter, MD;  Location: [place];  Service: Orthopedics;  Laterality: Right;  . Cataract extraction, bilateral      Allergies  Allergies  Allergen Reactions  . Amlodipine Besylate Swelling  . Lodine [Etodolac] Hives  . Naprosyn [Naproxen] Hives  . Penicillins Hives    REACTION: hives  . Pneumovax [Pneumococcal Polysaccharide Vaccine]     Whelps   . Robitussin Dm [Dextromethorphan-Guaifenesin] Hives  . Sulfonamide Derivatives Hives    REACTION: hives   Chief complain: Bruising  HPI  A very pleasant 81 year old female with h/o chronic  persistent a-fib on metoprolol and xarelto seen the last year for concern of murmur, fatigue and SOB. She refused to have a Lexiscan nuclear stress test, but underwent echocardiogram that showed mild AI, moderate TR, mild pulmonary HTN with RVSP 37 mmHg.  The patient still lived independently in a condo but feels down about slowing down and not being able to do what she used to do in the past.  She denies chest pain again, she is complaint with her meds. Recently she has noticed significant bruising on her arms and also in her old hip replacement scar.  Her crea increased 1.1 --> 1.44. Denies orthopnea, PND, falls, palpitations or syncope.   Home Medications  Prior to Admission medications   Medication Sig Start Date End Date Taking? Authorizing Provider  Ascorbic Acid (VITAMIN C) 1000 MG tablet Take 1,000 mg by mouth daily.    Yes Historical Provider, MD  calcium citrate-vitamin D (CITRACAL+D) 315-200 MG-UNIT per tablet Take 1 tablet by mouth daily.    Yes Historical Provider, MD  Cyanocobalamin (B-12) 1000 MCG TBCR Take 1 tablet by mouth daily.    Yes Historical Provider, MD  ergocalciferol (VITAMIN D2) 50000 UNITS capsule Take 50,000 Units by mouth every 30 (thirty) days.    Yes Historical Provider, MD  hydrochlorothiazide (HYDRODIURIL) 25 MG tablet Take 25 mg by mouth daily.   Yes  Historical Provider, MD  levothyroxine (SYNTHROID, LEVOTHROID) 100 MCG tablet Take 100 mcg by mouth daily.    Yes Historical Provider, MD  metoprolol (LOPRESSOR) 50 MG tablet Take 1 tablet by mouth 2 (two) times daily. 08/28/13  Yes Historical Provider, MD  Multiple Vitamin (MULTIVITAMIN) tablet Take 1 tablet by mouth daily.     Yes Historical Provider, MD  NON FORMULARY Take 1 tablet by mouth 2 (two) times daily. Macular Degeneration Supp. Take 1 tab by mouth twice daily.   Yes Historical Provider, MD  omeprazole (PRILOSEC) 20 MG capsule Take 20 mg by mouth every other day.    Yes Historical Provider, MD  XARELTO 15  MG TABS tablet Take 1 tablet by mouth daily. 04/11/12  Yes Historical Provider, MD    Family History  Family History  Problem Relation Age of Onset  . Breast cancer Daughter   . Heart attack Brother   . Stroke Mother   . Rheum arthritis Father   . Lung cancer Father   . Lung cancer Sister     Social History  History   Social History  . Marital Status: Widowed    Spouse Name: N/A  . Number of Children: 3  . Years of Education: N/A   Occupational History  . 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 Smoking status: Never Smoker   . Smokeless tobacco: Never Used  . Alcohol Use: No  . Drug Use: No  . Sexual Activity: Not on file   Other Topics Concern  . Not on file   Social History Narrative   Husband deceased of colon cancer. Lives alone.  Ambulates with a cane.  Has a walker.     Review of Systems, as per HPI, otherwise negative General:  No chills, fever, night sweats or weight changes.  Cardiovascular:  No chest pain, dyspnea on exertion, edema, orthopnea, palpitations, paroxysmal nocturnal dyspnea. Dermatological: No rash, lesions/masses Respiratory: No cough, dyspnea Urologic: No hematuria, dysuria Abdominal:   No nausea, vomiting, diarrhea, bright red blood per rectum, melena, or hematemesis Neurologic:  No visual changes, wkns, changes in mental status. All other systems reviewed and are otherwise negative except as noted above.  Physical Exam  Blood pressure 138/80, pulse 63, height 5\' 2"  (1.575 m), weight 160 lb (72.576 kg).  General: Pleasant, NAD Psych: Normal affect. Neuro: Alert and oriented X 3. Moves all extremities spontaneously. HEENT: Normal  Neck: Supple without bruits or JVD. Lungs:  Resp regular and unlabored, CTA. Heart: RRR no s3, s4, 2/6 systolic murmur at the LLSB. Diastolic murmur at the RUSB. Abdomen: Soft, non-tender, non-distended, BS + x 4.  Extremities: No clubbing, cyanosis or edema. DP/PT/Radials 2+  and equal bilaterally. Bruising in the UE, at the hemiarthroplasty scar  Labs:  No results for input(s): CKTOTAL, CKMB, TROPONINI in the last 72 hours. Lab Results  Component Value Date   WBC 5.7 06/13/2014   HGB 10.5* 06/13/2014   HCT 31.9* 06/13/2014   MCV 96.4 06/13/2014   PLT 198 06/13/2014    No results found for: DDIMER Invalid input(s): POCBNP    Component Value Date/Time   NA 141 06/16/2014 0423   K 4.6 06/16/2014 0423   CL 107 06/16/2014 0423   CO2 27 06/16/2014 0423   GLUCOSE 111* 06/16/2014 0423   BUN 18 06/16/2014 0423   CREATININE 1.44* 06/16/2014 0423   CALCIUM 9.2 06/16/2014 0423   GFRNONAA 31* 06/16/2014 0423  GFRAA 36* 06/16/2014 0423   No results found for: CHOL  Accessory Clinical Findings  TTE: 12/2013 - Left ventricle: The cavity size was normal. Wall thickness was normal. Systolic function was normal. The estimated ejection fraction was in the range of 50% to 55%. Wall motion was normal; there were no regional wall motion abnormalities. - Aortic valve: There was mild regurgitation. - Left atrium: The atrium was moderately dilated. - Right atrium: The atrium was mildly dilated. - Tricuspid valve: There was moderate regurgitation. - Pulmonary arteries: Systolic pressure was mildly increased. PA peak pressure: 37 mm Hg (S).  ECG - a-fib , 68 BPM, non-specific T wave abnormalities    Assessment & Plan  81 year old female   1. Progressive DOE, fatigue - stable since the last year, echo shows normal systolic function, mild pulmonary HTN. I would continue the same regimen.   2. A-fib - chronic persistent, rate controlled, on Xarelto, with significant bruising, we will switch to eliquis 2.5 mg po BID and see if that improves her bruising. She should technically still be on Eliquis 5 mg po bid, however is borderline crea 1.44, unsteady gait and bruising, we will go with the lower dose.   3. Murmur - mild AI, moderate TR, no further  therapy needed, we will follow   4. Hypertension - controlled   Follow up in 3 month.   Dorothy Spark, MD, [facility] 06/29/2014, 11:07 AM

## 2014-06-29 NOTE — Patient Instructions (Signed)
Medication Instructions:   STOP XARELTO NOW  START ELIQUIS 2.5 MG TWICE DAILY    Follow-Up:  3 MONTHS WITH DR Meda Coffee

## 2014-07-06 ENCOUNTER — Ambulatory Visit
Admission: RE | Admit: 2014-07-06 | Discharge: 2014-07-06 | Disposition: A | Discharge status: Home / Self Care | Payer: Medicare Other | Source: Ambulatory Visit | Attending: Orthopedic Surgery | Admitting: Orthopedic Surgery

## 2014-07-06 DIAGNOSIS — M5136 Other intervertebral disc degeneration, lumbar region: Secondary | ICD-10-CM | POA: Diagnosis not present

## 2014-07-06 DIAGNOSIS — M48061 Spinal stenosis, lumbar region without neurogenic claudication: Secondary | ICD-10-CM

## 2014-07-06 DIAGNOSIS — M47816 Spondylosis without myelopathy or radiculopathy, lumbar region: Secondary | ICD-10-CM | POA: Diagnosis not present

## 2014-07-06 DIAGNOSIS — M4186 Other forms of scoliosis, lumbar region: Secondary | ICD-10-CM | POA: Diagnosis not present

## 2014-07-06 DIAGNOSIS — M4807 Spinal stenosis, lumbosacral region: Secondary | ICD-10-CM | POA: Diagnosis not present

## 2014-07-06 MED ORDER — IOHEXOL 180 MG/ML  SOLN
15.0000 mL | Freq: Once | INTRAMUSCULAR | Status: AC | PRN
  Administered 2014-07-06: 15 mL via INTRATHECAL

## 2014-07-06 NOTE — Discharge Instructions (Signed)

## 2014-07-15 DIAGNOSIS — M4807 Spinal stenosis, lumbosacral region: Secondary | ICD-10-CM | POA: Diagnosis not present

## 2014-07-15 DIAGNOSIS — M5136 Other intervertebral disc degeneration, lumbar region: Secondary | ICD-10-CM | POA: Diagnosis not present

## 2014-07-19 ENCOUNTER — Telehealth: Payer: Self-pay

## 2014-07-19 NOTE — Telephone Encounter (Signed)
Several letters  from [place] today stating Eliquis was denied. We haven't even tried to get it approved yet, due to the 3 day holiday. i tried to call several different numbers given to me, but will just fax an appeal.

## 2014-07-26 ENCOUNTER — Telehealth: Payer: Self-pay

## 2014-07-26 NOTE — Telephone Encounter (Signed)
Fax received from [facility] with approval of Eliquis good through 07/21/2015. Auth # [id number]. Pharmacy notified

## 2014-08-05 DIAGNOSIS — M4807 Spinal stenosis, lumbosacral region: Secondary | ICD-10-CM | POA: Diagnosis not present

## 2014-08-05 DIAGNOSIS — M5136 Other intervertebral disc degeneration, lumbar region: Secondary | ICD-10-CM | POA: Diagnosis not present

## 2014-08-15 ENCOUNTER — Other Ambulatory Visit: Payer: Self-pay

## 2014-09-02 DIAGNOSIS — M4807 Spinal stenosis, lumbosacral region: Secondary | ICD-10-CM | POA: Diagnosis not present

## 2014-09-05 DIAGNOSIS — H35363 Drusen (degenerative) of macula, bilateral: Secondary | ICD-10-CM | POA: Diagnosis not present

## 2014-09-05 DIAGNOSIS — H3531 Nonexudative age-related macular degeneration: Secondary | ICD-10-CM | POA: Diagnosis not present

## 2014-09-05 DIAGNOSIS — H34831 Tributary (branch) retinal vein occlusion, right eye: Secondary | ICD-10-CM | POA: Diagnosis not present

## 2014-09-19 ENCOUNTER — Other Ambulatory Visit: Payer: Self-pay | Admitting: Cardiology

## 2014-09-29 ENCOUNTER — Ambulatory Visit: Payer: Medicare Other | Admitting: Cardiology

## 2014-10-11 DIAGNOSIS — H34239 Retinal artery branch occlusion, unspecified eye: Secondary | ICD-10-CM | POA: Diagnosis not present

## 2014-10-11 DIAGNOSIS — H40013 Open angle with borderline findings, low risk, bilateral: Secondary | ICD-10-CM | POA: Diagnosis not present

## 2014-10-11 DIAGNOSIS — H04123 Dry eye syndrome of bilateral lacrimal glands: Secondary | ICD-10-CM | POA: Diagnosis not present

## 2014-10-11 DIAGNOSIS — H3531 Nonexudative age-related macular degeneration: Secondary | ICD-10-CM | POA: Diagnosis not present

## 2014-10-27 ENCOUNTER — Ambulatory Visit: Payer: Medicare Other | Admitting: Cardiology

## 2014-11-18 ENCOUNTER — Ambulatory Visit (INDEPENDENT_AMBULATORY_CARE_PROVIDER_SITE_OTHER): Payer: Medicare Other | Admitting: Cardiology

## 2014-11-18 ENCOUNTER — Encounter: Payer: Self-pay | Admitting: Cardiology

## 2014-11-18 VITALS — BP 130/68 | HR 60 | Ht 62.0 in | Wt 158.0 lb

## 2014-11-18 DIAGNOSIS — I10 Essential (primary) hypertension: Secondary | ICD-10-CM | POA: Diagnosis not present

## 2014-11-18 DIAGNOSIS — I270 Primary pulmonary hypertension: Secondary | ICD-10-CM

## 2014-11-18 DIAGNOSIS — I481 Persistent atrial fibrillation: Secondary | ICD-10-CM | POA: Diagnosis not present

## 2014-11-18 DIAGNOSIS — I4819 Other persistent atrial fibrillation: Secondary | ICD-10-CM

## 2014-11-18 DIAGNOSIS — I272 Pulmonary hypertension, unspecified: Secondary | ICD-10-CM

## 2014-11-18 NOTE — Progress Notes (Signed)
Patient ID: BRECKEN WALTH, female   DOB: December 18, 1923, 81 y.o.   MRN: [id number]    Patient Name: [name] Date of Encounter: 11/18/2014  Primary Care Provider:  Irven Shelling, MD Primary Cardiologist: Dorothy Spark  Problem List   Past Medical History  Diagnosis Date  . GERD (gastroesophageal reflux disease)   . Osteoporosis     fosamax 10 yrs, reclast 3 yrs  . Hypothyroidism   . Scoliosis   . Varicose veins   . Hypertension   . Hx of blood clots 60's    eye rt  . Allergic cough   . Heart murmur   . Peripheral vascular disease     blood clot behind rt eye   . Arthritis   . Thyroid nodule   . History of PSVT (paroxysmal supraventricular tachycardia)   . Primary hyperparathyroidism 2012  . Chronic atrial fibrillation 12/13    normal EF, severe tricuspid reguritation  . PVC's (premature ventricular contractions)     history of  . Facial neuralgia     atypical  . Hyperplastic colon polyp   . Macular degeneration of left eye   . Bronchitis, chronic with acute exacerbation 04/25/2011  . Stage III chronic kidney disease    Past Surgical History  Procedure Laterality Date  . Total hip arthroplasty  1981,1982,2001,2007,    Right and Left  . Reverse shoulder arthroplasty Right 11/201/2014    Dr Onnie Graham  . Reverse shoulder arthroplasty Right 01/07/2013    Procedure: RIGHT REVERSE SHOULDER ARTHROPLASTY;  Surgeon: Marin Shutter, MD;  Location: [place];  Service: Orthopedics;  Laterality: Right;  . Cataract extraction, bilateral     Allergies  Allergies  Allergen Reactions  . Amlodipine Besylate Swelling  . Lodine [Etodolac] Hives  . Naprosyn [Naproxen] Hives  . Penicillins Hives    REACTION: hives  . Pneumovax [Pneumococcal Polysaccharide Vaccine]     Whelps   . Robitussin Dm [Dextromethorphan-Guaifenesin] Hives  . Sulfonamide Derivatives Hives    REACTION: hives   Chief complain: Bruising  HPI  A very pleasant 81 year old female with h/o chronic  persistent a-fib on metoprolol and xarelto seen the last year for concern of murmur, fatigue and SOB. She refused to have a Lexiscan nuclear stress test, but underwent echocardiogram that showed mild AI, moderate TR, mild pulmonary HTN with RVSP 37 mmHg.  The patient still lived independently in a condo but feels down about slowing down and not being able to do what she used to do in the past.  She denies chest pain again, she is complaint with her meds. Recently she has noticed significant bruising on her arms and also in her old hip replacement scar.  Her crea increased 1.1 --> 1.44. Denies orthopnea, PND, falls, palpitations or syncope.   11/18/2014 - the patient is feeling well, on and off DOE, no LE edema, no chest pain , no orthopnea, PND.  Her bruising resolved with switching xarelto to Eliquis. No falls, no blood in stool or urine.   Home Medications  Prior to Admission medications   Medication Sig Start Date End Date Taking? Authorizing Provider  Ascorbic Acid (VITAMIN C) 1000 MG tablet Take 1,000 mg by mouth daily.    Yes Historical Provider, MD  calcium citrate-vitamin D (CITRACAL+D) 315-200 MG-UNIT per tablet Take 1 tablet by mouth daily.    Yes Historical Provider, MD  Cyanocobalamin (B-12) 1000 MCG TBCR Take 1 tablet by mouth daily.    Yes Historical Provider,  MD  ergocalciferol (VITAMIN D2) 50000 UNITS capsule Take 50,000 Units by mouth every 30 (thirty) days.    Yes Historical Provider, MD  hydrochlorothiazide (HYDRODIURIL) 25 MG tablet Take 25 mg by mouth daily.   Yes Historical Provider, MD  levothyroxine (SYNTHROID, LEVOTHROID) 100 MCG tablet Take 100 mcg by mouth daily.    Yes Historical Provider, MD  metoprolol (LOPRESSOR) 50 MG tablet Take 1 tablet by mouth 2 (two) times daily. 08/28/13  Yes Historical Provider, MD  Multiple Vitamin (MULTIVITAMIN) tablet Take 1 tablet by mouth daily.     Yes Historical Provider, MD  NON FORMULARY Take 1 tablet by mouth 2 (two) times daily.  Macular Degeneration Supp. Take 1 tab by mouth twice daily.   Yes Historical Provider, MD  omeprazole (PRILOSEC) 20 MG capsule Take 20 mg by mouth every other day.    Yes Historical Provider, MD  XARELTO 15 MG TABS tablet Take 1 tablet by mouth daily. 04/11/12  Yes Historical Provider, MD    Family History  Family History  Problem Relation Age of Onset  . Breast cancer Daughter   . Heart attack Brother   . Stroke Mother   . Rheum arthritis Father   . Lung cancer Father   . Lung cancer Sister     Social History  Social History   Social History  . Marital Status: Widowed    Spouse Name: N/A  . Number of Children: 3  . Years of Education: N/A   Occupational History  . 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 Smoking status: Never Smoker   . Smokeless tobacco: Never Used  . Alcohol Use: No  . Drug Use: No  . Sexual Activity: Not on file   Other Topics Concern  . Not on file   Social History Narrative   Husband deceased of colon cancer. Lives alone.  Ambulates with a cane.  Has a walker.     Review of Systems, as per HPI, otherwise negative General:  No chills, fever, night sweats or weight changes.  Cardiovascular:  No chest pain, dyspnea on exertion, edema, orthopnea, palpitations, paroxysmal nocturnal dyspnea. Dermatological: No rash, lesions/masses Respiratory: No cough, dyspnea Urologic: No hematuria, dysuria Abdominal:   No nausea, vomiting, diarrhea, bright red blood per rectum, melena, or hematemesis Neurologic:  No visual changes, wkns, changes in mental status. All other systems reviewed and are otherwise negative except as noted above.  Physical Exam  Blood pressure 130/68, pulse 60, height 5\' 2"  (1.575 m), weight 158 lb (71.668 kg).  General: Pleasant, NAD Psych: Normal affect. Neuro: Alert and oriented X 3. Moves all extremities spontaneously. HEENT: Normal  Neck: Supple without bruits or JVD. Lungs:  Resp regular and  unlabored, CTA. Heart: RRR no s3, s4, 2/6 systolic murmur at the LLSB. Diastolic murmur at the RUSB. Abdomen: Soft, non-tender, non-distended, BS + x 4.  Extremities: No clubbing, cyanosis or edema. DP/PT/Radials 2+ and equal bilaterally. Bruising in the UE, at the hemiarthroplasty scar  Labs:  No results for input(s): CKTOTAL, CKMB, TROPONINI in the last 72 hours. Lab Results  Component Value Date   WBC 5.7 06/13/2014   HGB 10.5* 06/13/2014   HCT 31.9* 06/13/2014   MCV 96.4 06/13/2014   PLT 198 06/13/2014    No results found for: DDIMER Invalid input(s): POCBNP    Component Value Date/Time   NA 141 06/16/2014 0423   K 4.6 06/16/2014 0423   CL 107  06/16/2014 0423   CO2 27 06/16/2014 0423   GLUCOSE 111* 06/16/2014 0423   BUN 18 06/16/2014 0423   CREATININE 1.44* 06/16/2014 0423   CALCIUM 9.2 06/16/2014 0423   GFRNONAA 31* 06/16/2014 0423   GFRAA 36* 06/16/2014 0423   No results found for: CHOL  Accessory Clinical Findings  TTE: 12/2013 - Left ventricle: The cavity size was normal. Wall thickness was normal. Systolic function was normal. The estimated ejection fraction was in the range of 50% to 55%. Wall motion was normal; there were no regional wall motion abnormalities. - Aortic valve: There was mild regurgitation. - Left atrium: The atrium was moderately dilated. - Right atrium: The atrium was mildly dilated. - Tricuspid valve: There was moderate regurgitation. - Pulmonary arteries: Systolic pressure was mildly increased. PA peak pressure: 37 mm Hg (S).  ECG - a-fib , 68 BPM, non-specific T wave abnormalities    Assessment & Plan  81 year old female   1. Progressive DOE, fatigue - stable since the last year, echo shows normal systolic function, mild pulmonary HTN. I would continue the same regimen.   2. A-fib - chronic persistent, rate controlled, previosuly on Xarelto, with significant bruising, resolved with Eliquis 2.5 mg po BID.   3. Murmur -  mild AI, moderate TR, no further therapy needed, we will follow   4. Hypertension - controlled   Follow up in 1 year.   Dorothy Spark, MD, [facility] 11/18/2014, 11:32 AM

## 2014-11-18 NOTE — Patient Instructions (Signed)
Your physician recommends that you continue on your current medications as directed. Please refer to the Current Medication list given to you today.   Your physician wants you to follow-up in: ONE YEAR WITH DR NELSON You will receive a reminder letter in the mail two months in advance. If you don't receive a letter, please call our office to schedule the follow-up appointment.  

## 2014-11-29 DIAGNOSIS — L821 Other seborrheic keratosis: Secondary | ICD-10-CM | POA: Diagnosis not present

## 2014-11-29 DIAGNOSIS — D2339 Other benign neoplasm of skin of other parts of face: Secondary | ICD-10-CM | POA: Diagnosis not present

## 2014-12-09 DIAGNOSIS — Z1389 Encounter for screening for other disorder: Secondary | ICD-10-CM | POA: Diagnosis not present

## 2014-12-09 DIAGNOSIS — E785 Hyperlipidemia, unspecified: Secondary | ICD-10-CM | POA: Diagnosis not present

## 2014-12-09 DIAGNOSIS — I10 Essential (primary) hypertension: Secondary | ICD-10-CM | POA: Diagnosis not present

## 2014-12-09 DIAGNOSIS — M545 Low back pain: Secondary | ICD-10-CM | POA: Diagnosis not present

## 2014-12-09 DIAGNOSIS — Z6829 Body mass index (BMI) 29.0-29.9, adult: Secondary | ICD-10-CM | POA: Diagnosis not present

## 2014-12-09 DIAGNOSIS — E559 Vitamin D deficiency, unspecified: Secondary | ICD-10-CM | POA: Diagnosis not present

## 2014-12-09 DIAGNOSIS — R0602 Shortness of breath: Secondary | ICD-10-CM | POA: Diagnosis not present

## 2014-12-09 DIAGNOSIS — E042 Nontoxic multinodular goiter: Secondary | ICD-10-CM | POA: Diagnosis not present

## 2014-12-09 DIAGNOSIS — D649 Anemia, unspecified: Secondary | ICD-10-CM | POA: Diagnosis not present

## 2014-12-09 DIAGNOSIS — D126 Benign neoplasm of colon, unspecified: Secondary | ICD-10-CM | POA: Diagnosis not present

## 2014-12-09 DIAGNOSIS — Z23 Encounter for immunization: Secondary | ICD-10-CM | POA: Diagnosis not present

## 2014-12-09 DIAGNOSIS — I480 Paroxysmal atrial fibrillation: Secondary | ICD-10-CM | POA: Diagnosis not present

## 2015-03-03 DIAGNOSIS — M7071 Other bursitis of hip, right hip: Secondary | ICD-10-CM | POA: Diagnosis not present

## 2015-03-03 DIAGNOSIS — Z96641 Presence of right artificial hip joint: Secondary | ICD-10-CM | POA: Diagnosis not present

## 2015-03-03 DIAGNOSIS — Z471 Aftercare following joint replacement surgery: Secondary | ICD-10-CM | POA: Diagnosis not present

## 2015-03-14 DIAGNOSIS — M7062 Trochanteric bursitis, left hip: Secondary | ICD-10-CM | POA: Diagnosis not present

## 2015-03-16 DIAGNOSIS — M7062 Trochanteric bursitis, left hip: Secondary | ICD-10-CM | POA: Diagnosis not present

## 2015-03-20 DIAGNOSIS — M7062 Trochanteric bursitis, left hip: Secondary | ICD-10-CM | POA: Diagnosis not present

## 2015-03-22 DIAGNOSIS — M7062 Trochanteric bursitis, left hip: Secondary | ICD-10-CM | POA: Diagnosis not present

## 2015-03-24 DIAGNOSIS — M7062 Trochanteric bursitis, left hip: Secondary | ICD-10-CM | POA: Diagnosis not present

## 2015-03-27 DIAGNOSIS — M7062 Trochanteric bursitis, left hip: Secondary | ICD-10-CM | POA: Diagnosis not present

## 2015-03-29 DIAGNOSIS — M7062 Trochanteric bursitis, left hip: Secondary | ICD-10-CM | POA: Diagnosis not present

## 2015-03-30 DIAGNOSIS — M7062 Trochanteric bursitis, left hip: Secondary | ICD-10-CM | POA: Diagnosis not present

## 2015-04-13 DIAGNOSIS — H40013 Open angle with borderline findings, low risk, bilateral: Secondary | ICD-10-CM | POA: Diagnosis not present

## 2015-04-13 DIAGNOSIS — H04123 Dry eye syndrome of bilateral lacrimal glands: Secondary | ICD-10-CM | POA: Diagnosis not present

## 2015-04-13 DIAGNOSIS — H524 Presbyopia: Secondary | ICD-10-CM | POA: Diagnosis not present

## 2015-04-19 ENCOUNTER — Other Ambulatory Visit: Payer: Self-pay | Admitting: Cardiology

## 2015-05-26 DIAGNOSIS — K640 First degree hemorrhoids: Secondary | ICD-10-CM | POA: Diagnosis not present

## 2015-06-06 DIAGNOSIS — Z79899 Other long term (current) drug therapy: Secondary | ICD-10-CM | POA: Diagnosis not present

## 2015-06-06 DIAGNOSIS — E210 Primary hyperparathyroidism: Secondary | ICD-10-CM | POA: Diagnosis not present

## 2015-06-06 DIAGNOSIS — I129 Hypertensive chronic kidney disease with stage 1 through stage 4 chronic kidney disease, or unspecified chronic kidney disease: Secondary | ICD-10-CM | POA: Diagnosis not present

## 2015-06-06 DIAGNOSIS — M810 Age-related osteoporosis without current pathological fracture: Secondary | ICD-10-CM | POA: Diagnosis not present

## 2015-06-06 DIAGNOSIS — Z1389 Encounter for screening for other disorder: Secondary | ICD-10-CM | POA: Diagnosis not present

## 2015-06-06 DIAGNOSIS — N183 Chronic kidney disease, stage 3 (moderate): Secondary | ICD-10-CM | POA: Diagnosis not present

## 2015-06-06 DIAGNOSIS — K219 Gastro-esophageal reflux disease without esophagitis: Secondary | ICD-10-CM | POA: Diagnosis not present

## 2015-06-06 DIAGNOSIS — Z0000 Encounter for general adult medical examination without abnormal findings: Secondary | ICD-10-CM | POA: Diagnosis not present

## 2015-06-06 DIAGNOSIS — I481 Persistent atrial fibrillation: Secondary | ICD-10-CM | POA: Diagnosis not present

## 2015-06-06 DIAGNOSIS — E559 Vitamin D deficiency, unspecified: Secondary | ICD-10-CM | POA: Diagnosis not present

## 2015-09-05 DIAGNOSIS — H34831 Tributary (branch) retinal vein occlusion, right eye, with macular edema: Secondary | ICD-10-CM | POA: Diagnosis not present

## 2015-09-05 DIAGNOSIS — H43811 Vitreous degeneration, right eye: Secondary | ICD-10-CM | POA: Diagnosis not present

## 2015-09-05 DIAGNOSIS — H353131 Nonexudative age-related macular degeneration, bilateral, early dry stage: Secondary | ICD-10-CM | POA: Diagnosis not present

## 2015-09-05 DIAGNOSIS — H35363 Drusen (degenerative) of macula, bilateral: Secondary | ICD-10-CM | POA: Diagnosis not present

## 2015-10-17 ENCOUNTER — Other Ambulatory Visit: Payer: Self-pay | Admitting: Cardiology

## 2015-10-17 DIAGNOSIS — H35033 Hypertensive retinopathy, bilateral: Secondary | ICD-10-CM | POA: Diagnosis not present

## 2015-10-17 DIAGNOSIS — H353130 Nonexudative age-related macular degeneration, bilateral, stage unspecified: Secondary | ICD-10-CM | POA: Diagnosis not present

## 2015-10-17 DIAGNOSIS — H04123 Dry eye syndrome of bilateral lacrimal glands: Secondary | ICD-10-CM | POA: Diagnosis not present

## 2015-10-17 DIAGNOSIS — H40013 Open angle with borderline findings, low risk, bilateral: Secondary | ICD-10-CM | POA: Diagnosis not present

## 2015-11-14 ENCOUNTER — Other Ambulatory Visit: Payer: Self-pay | Admitting: Cardiology

## 2015-11-22 ENCOUNTER — Encounter: Payer: Self-pay | Admitting: Cardiology

## 2015-12-05 DIAGNOSIS — I129 Hypertensive chronic kidney disease with stage 1 through stage 4 chronic kidney disease, or unspecified chronic kidney disease: Secondary | ICD-10-CM | POA: Diagnosis not present

## 2015-12-05 DIAGNOSIS — I481 Persistent atrial fibrillation: Secondary | ICD-10-CM | POA: Diagnosis not present

## 2015-12-05 DIAGNOSIS — K219 Gastro-esophageal reflux disease without esophagitis: Secondary | ICD-10-CM | POA: Diagnosis not present

## 2015-12-05 DIAGNOSIS — Z23 Encounter for immunization: Secondary | ICD-10-CM | POA: Diagnosis not present

## 2015-12-06 ENCOUNTER — Ambulatory Visit (INDEPENDENT_AMBULATORY_CARE_PROVIDER_SITE_OTHER): Payer: Medicare Other | Admitting: Cardiology

## 2015-12-06 ENCOUNTER — Encounter: Payer: Self-pay | Admitting: Cardiology

## 2015-12-06 VITALS — BP 172/94 | HR 56 | Ht 62.0 in | Wt 165.4 lb

## 2015-12-06 DIAGNOSIS — I481 Persistent atrial fibrillation: Secondary | ICD-10-CM | POA: Diagnosis not present

## 2015-12-06 DIAGNOSIS — I351 Nonrheumatic aortic (valve) insufficiency: Secondary | ICD-10-CM | POA: Diagnosis not present

## 2015-12-06 DIAGNOSIS — R0600 Dyspnea, unspecified: Secondary | ICD-10-CM

## 2015-12-06 DIAGNOSIS — I272 Pulmonary hypertension, unspecified: Secondary | ICD-10-CM | POA: Diagnosis not present

## 2015-12-06 DIAGNOSIS — R0609 Other forms of dyspnea: Secondary | ICD-10-CM

## 2015-12-06 DIAGNOSIS — I119 Hypertensive heart disease without heart failure: Secondary | ICD-10-CM | POA: Diagnosis not present

## 2015-12-06 DIAGNOSIS — I4819 Other persistent atrial fibrillation: Secondary | ICD-10-CM

## 2015-12-06 MED ORDER — APIXABAN 2.5 MG PO TABS: 2.5000 mg | ORAL_TABLET | Freq: Two times a day (BID) | ORAL | 4 refills | Status: DC

## 2015-12-06 NOTE — Progress Notes (Signed)
Patient ID: [name], female   DOB: [birth year], 82 y.o.   MRN: [id number]    Patient Name: [name] Date of Encounter: 12/06/2015  Primary Care Provider:  Irven Shelling, MD Primary Cardiologist: [name]  Problem List   Past Medical History:  Diagnosis Date  . Allergic cough   . Arthritis   . Bronchitis, chronic with acute exacerbation ([place]) 04/25/2011  . Chronic atrial fibrillation (HCC) 12/13   normal EF, severe tricuspid reguritation  . Facial neuralgia    atypical  . GERD (gastroesophageal reflux disease)   . Heart murmur   . History of PSVT (paroxysmal supraventricular tachycardia)   . Hx of blood clots 60's   eye rt  . Hyperplastic colon polyp   . Hypertension   . Hypothyroidism   . Macular degeneration of left eye   . Osteoporosis    fosamax 10 yrs, reclast 3 yrs  . Peripheral vascular disease ([place])    blood clot behind rt eye   . Primary hyperparathyroidism ([place]) 2012  . PVC's (premature ventricular contractions)    history of  . Scoliosis   . Stage III chronic kidney disease   . Thyroid nodule   . Varicose veins    Past Surgical History:  Procedure Laterality Date  . CATARACT EXTRACTION, BILATERAL    . REVERSE SHOULDER ARTHROPLASTY Right 11/201/2014   Dr [name]  . REVERSE SHOULDER ARTHROPLASTY Right 01/07/2013   Procedure: RIGHT REVERSE SHOULDER ARTHROPLASTY;  Surgeon: [name] Shutter, MD;  Location: [facility];  Service: Orthopedics;  Laterality: Right;  . TOTAL HIP ARTHROPLASTY  1981,1982,2001,2007,   Right and Left   Allergies  Allergies  Allergen Reactions  . Amlodipine Besylate Swelling  . Lodine [Etodolac] Hives  . Naprosyn [Naproxen] Hives  . Penicillins Hives    REACTION: hives  . Pneumovax [Pneumococcal Polysaccharide Vaccine]     [place]   . Robitussin Dm [Dextromethorphan-Guaifenesin] Hives  . Sulfonamide Derivatives Hives    REACTION: hives   Chief complain: Bruising  HPI  A very pleasant 82 year old female  with h/o chronic persistent a-fib on metoprolol and xarelto seen the last year for concern of murmur, fatigue and SOB. She refused to have a Lexiscan nuclear stress test, but underwent echocardiogram that showed mild AI, moderate TR, mild pulmonary HTN with RVSP 37 mmHg.  The patient still lived independently in a condo but feels down about slowing down and not being able to do what she used to do in the past.  She denies chest pain again, she is complaint with her meds. Recently she has noticed significant bruising on her arms and also in her old hip replacement scar.  Her crea increased 1.1 --> 1.44. Denies orthopnea, PND, falls, palpitations or syncope.   12/06/2015- the patient is feeling well, she continues to walk with a cane, denies any dizziness or falls no lower extremity edema no bruising or bleeding. Her concern is that she is falling into donut hole with Eliquis. He denies chest pain but has some exertional dyspnea that hasn't changed in the last few years. No palpitations no syncope. No orthopnea or paroxysmal nocturnal dyspnea. No blood in stool or urine.   Home Medications  Prior to Admission medications   Medication Sig Start Date End Date Taking? Authorizing Provider  Ascorbic Acid (VITAMIN C) 1000 MG tablet Take 1,000 mg by mouth daily.    Yes Historical Provider, MD  calcium citrate-vitamin D (CITRACAL+D) 315-200 MG-UNIT per tablet Take 1 tablet by  mouth daily.    Yes Historical Provider, MD  Cyanocobalamin (B-12) 1000 MCG TBCR Take 1 tablet by mouth daily.    Yes Historical Provider, MD  ergocalciferol (VITAMIN D2) 50000 UNITS capsule Take 50,000 Units by mouth every 30 (thirty) days.    Yes Historical Provider, MD  hydrochlorothiazide (HYDRODIURIL) 25 MG tablet Take 25 mg by mouth daily.   Yes Historical Provider, MD  levothyroxine (SYNTHROID, LEVOTHROID) 100 MCG tablet Take 100 mcg by mouth daily.    Yes Historical Provider, MD  metoprolol (LOPRESSOR) 50 MG tablet Take 1  tablet by mouth 2 (two) times daily. 08/28/13  Yes Historical Provider, MD  Multiple Vitamin (MULTIVITAMIN) tablet Take 1 tablet by mouth daily.     Yes Historical Provider, MD  NON FORMULARY Take 1 tablet by mouth 2 (two) times daily. Macular Degeneration Supp. Take 1 tab by mouth twice daily.   Yes Historical Provider, MD  omeprazole (PRILOSEC) 20 MG capsule Take 20 mg by mouth every other day.    Yes Historical Provider, MD  XARELTO 15 MG TABS tablet Take 1 tablet by mouth daily. 04/11/12  Yes Historical Provider, MD    Family History  Family History  Problem Relation Age of Onset  . Stroke Mother   . Rheum arthritis Father   . Lung cancer Father   . Breast cancer Daughter   . Heart attack Brother   . Lung cancer Sister     Social History  Social History   Social History  . Marital status: Widowed    Spouse name: N/A  . Number of children: 3  . Years of education: N/A   Occupational History  . 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 Smoking status: Never Smoker  . Smokeless tobacco: Never Used  . Alcohol use No  . Drug use: No  . Sexual activity: Not on file   Other Topics Concern  . Not on file   Social History Narrative   Husband deceased of colon cancer. Lives alone.  Ambulates with a cane.  Has a walker.     Review of Systems, as per HPI, otherwise negative General:  No chills, fever, night sweats or weight changes.  Cardiovascular:  No chest pain, dyspnea on exertion, edema, orthopnea, palpitations, paroxysmal nocturnal dyspnea. Dermatological: No rash, lesions/masses Respiratory: No cough, dyspnea Urologic: No hematuria, dysuria Abdominal:   No nausea, vomiting, diarrhea, bright red blood per rectum, melena, or hematemesis Neurologic:  No visual changes, wkns, changes in mental status. All other systems reviewed and are otherwise negative except as noted above.  Physical Exam  Blood pressure (!) 172/94, pulse (!) 56,  height 5\' 2"  (1.575 m), weight 165 lb 6.4 oz (75 kg), SpO2 99 %.  General: Pleasant, NAD Psych: Normal affect. Neuro: Alert and oriented X 3. Moves all extremities spontaneously. HEENT: Normal  Neck: Supple without bruits or JVD. Lungs:  Resp regular and unlabored, CTA. Heart: RRR no s3, s4, 2/6 systolic murmur at the LLSB. Diastolic murmur at the RUSB. Abdomen: Soft, non-tender, non-distended, BS + x 4.  Extremities: No clubbing, cyanosis or edema. DP/PT/Radials 2+ and equal bilaterally. Bruising in the UE, at the hemiarthroplasty scar  Labs:  No results for input(s): CKTOTAL, CKMB, TROPONINI in the last 72 hours. Lab Results  Component Value Date   WBC 5.7 06/13/2014   HGB 10.5 (L) 06/13/2014   HCT 31.9 (L) 06/13/2014   MCV 96.4 06/13/2014   PLT  198 06/13/2014    No results found for: DDIMER Invalid input(s): POCBNP    Component Value Date/Time   NA 141 06/16/2014 0423   K 4.6 06/16/2014 0423   CL 107 06/16/2014 0423   CO2 27 06/16/2014 0423   GLUCOSE 111 (H) 06/16/2014 0423   BUN 18 06/16/2014 0423   CREATININE 1.44 (H) 06/16/2014 0423   CALCIUM 9.2 06/16/2014 0423   GFRNONAA 31 (L) 06/16/2014 0423   GFRAA 36 (L) 06/16/2014 0423   No results found for: CHOL  Accessory Clinical Findings  TTE: 12/2013 - Left ventricle: The cavity size was normal. Wall thickness was normal. Systolic function was normal. The estimated ejection fraction was in the range of 50% to 55%. Wall motion was normal; there were no regional wall motion abnormalities. - Aortic valve: There was mild regurgitation. - Left atrium: The atrium was moderately dilated. - Right atrium: The atrium was mildly dilated. - Tricuspid valve: There was moderate regurgitation. - Pulmonary arteries: Systolic pressure was mildly increased. PA peak pressure: 37 mm Hg (S).  ECG - a-fib , 68 BPM, non-specific T wave abnormalities    Assessment & Plan  82 year old female   1. Hypertensive heart  disease without CHF - repeat BP 140/84 mmHg, he states that at home already and 140 range only elevated if measured shortly after walking.  2. Pulmonary hypertension  - mild with RVSP 37 mmHg stable dyspnea on exertion no need to repeat echo right now and continue the same regimen.   3. A-fib - chronic persistent, rate controlled, previosuly on Xarelto, with significant bruising, resolved with Eliquis 2.5 mg po BID. Now falling into the donut hole, we will provide a coupon for pharmacy.  4. Murmur - mild AI, moderate TR, no further therapy needed, we will follow   Follow up in 1 year. Labs followed by PCP, Crea 1.44 in 2016, per patient stable.  [name] Dawley, MD, [facility] 12/06/2015, 8:52 AM

## 2015-12-06 NOTE — Patient Instructions (Signed)

## 2015-12-11 DIAGNOSIS — E784 Other hyperlipidemia: Secondary | ICD-10-CM | POA: Diagnosis not present

## 2015-12-11 DIAGNOSIS — M810 Age-related osteoporosis without current pathological fracture: Secondary | ICD-10-CM | POA: Diagnosis not present

## 2015-12-11 DIAGNOSIS — Z1389 Encounter for screening for other disorder: Secondary | ICD-10-CM | POA: Diagnosis not present

## 2015-12-11 DIAGNOSIS — I480 Paroxysmal atrial fibrillation: Secondary | ICD-10-CM | POA: Diagnosis not present

## 2015-12-11 DIAGNOSIS — I10 Essential (primary) hypertension: Secondary | ICD-10-CM | POA: Diagnosis not present

## 2015-12-11 DIAGNOSIS — E042 Nontoxic multinodular goiter: Secondary | ICD-10-CM | POA: Diagnosis not present

## 2015-12-11 DIAGNOSIS — I2789 Other specified pulmonary heart diseases: Secondary | ICD-10-CM | POA: Diagnosis not present

## 2015-12-11 DIAGNOSIS — M545 Low back pain: Secondary | ICD-10-CM | POA: Diagnosis not present

## 2015-12-11 DIAGNOSIS — Z6829 Body mass index (BMI) 29.0-29.9, adult: Secondary | ICD-10-CM | POA: Diagnosis not present

## 2015-12-11 DIAGNOSIS — D6489 Other specified anemias: Secondary | ICD-10-CM | POA: Diagnosis not present

## 2015-12-11 DIAGNOSIS — E559 Vitamin D deficiency, unspecified: Secondary | ICD-10-CM | POA: Diagnosis not present

## 2016-06-07 DIAGNOSIS — I480 Paroxysmal atrial fibrillation: Secondary | ICD-10-CM | POA: Diagnosis not present

## 2016-06-07 DIAGNOSIS — I129 Hypertensive chronic kidney disease with stage 1 through stage 4 chronic kidney disease, or unspecified chronic kidney disease: Secondary | ICD-10-CM | POA: Diagnosis not present

## 2016-06-07 DIAGNOSIS — K219 Gastro-esophageal reflux disease without esophagitis: Secondary | ICD-10-CM | POA: Diagnosis not present

## 2016-06-07 DIAGNOSIS — Z1389 Encounter for screening for other disorder: Secondary | ICD-10-CM | POA: Diagnosis not present

## 2016-06-07 DIAGNOSIS — Z0000 Encounter for general adult medical examination without abnormal findings: Secondary | ICD-10-CM | POA: Diagnosis not present

## 2016-06-07 DIAGNOSIS — N183 Chronic kidney disease, stage 3 (moderate): Secondary | ICD-10-CM | POA: Diagnosis not present

## 2016-06-13 DIAGNOSIS — N183 Chronic kidney disease, stage 3 (moderate): Secondary | ICD-10-CM | POA: Diagnosis not present

## 2016-06-13 DIAGNOSIS — I129 Hypertensive chronic kidney disease with stage 1 through stage 4 chronic kidney disease, or unspecified chronic kidney disease: Secondary | ICD-10-CM | POA: Diagnosis not present

## 2016-06-17 DIAGNOSIS — K219 Gastro-esophageal reflux disease without esophagitis: Secondary | ICD-10-CM | POA: Diagnosis not present

## 2016-06-17 DIAGNOSIS — I480 Paroxysmal atrial fibrillation: Secondary | ICD-10-CM | POA: Diagnosis not present

## 2016-06-17 DIAGNOSIS — I129 Hypertensive chronic kidney disease with stage 1 through stage 4 chronic kidney disease, or unspecified chronic kidney disease: Secondary | ICD-10-CM | POA: Diagnosis not present

## 2016-06-17 DIAGNOSIS — N183 Chronic kidney disease, stage 3 (moderate): Secondary | ICD-10-CM | POA: Diagnosis not present

## 2016-06-28 ENCOUNTER — Other Ambulatory Visit: Payer: Self-pay | Admitting: Internal Medicine

## 2016-06-28 DIAGNOSIS — I10 Essential (primary) hypertension: Secondary | ICD-10-CM

## 2016-07-04 DIAGNOSIS — I129 Hypertensive chronic kidney disease with stage 1 through stage 4 chronic kidney disease, or unspecified chronic kidney disease: Secondary | ICD-10-CM | POA: Diagnosis not present

## 2016-07-04 DIAGNOSIS — I10 Essential (primary) hypertension: Secondary | ICD-10-CM | POA: Diagnosis not present

## 2016-07-04 DIAGNOSIS — N183 Chronic kidney disease, stage 3 (moderate): Secondary | ICD-10-CM | POA: Diagnosis not present

## 2016-07-05 ENCOUNTER — Ambulatory Visit
Admission: RE | Admit: 2016-07-05 | Discharge: 2016-07-05 | Disposition: A | Discharge status: Home / Self Care | Payer: Medicare Other | Source: Ambulatory Visit | Attending: Internal Medicine | Admitting: Internal Medicine

## 2016-07-05 DIAGNOSIS — I10 Essential (primary) hypertension: Secondary | ICD-10-CM

## 2016-07-29 IMAGING — NM NM BONE 3 PHASE
10 series · 20 of 20 positions shown · non-contrast
Comparison: None.

CLINICAL DATA: Bilateral hip pain. Right hip replacement 10 years
prior. Left hip replacement 30 years prior.

EXAM:
NUCLEAR MEDICINE 3-PHASE BONE SCAN
TECHNIQUE: Radionuclide angiographic images, immediate static blood pool
images, and 3-hour delayed static images were obtained of the hips
after intravenous injection of radiopharmaceutical.
RADIOPHARMACEUTICALS:  24.2 mCi Jechnetium-VVm MDP IV

[Series 1: flow · 4.14mm/px · 6 of 40 frames shown (1 of 2)]
[frame 4/40  full-range]
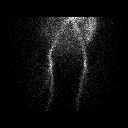
[frame 10/40  full-range]
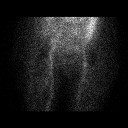
[frame 17/40  full-range]
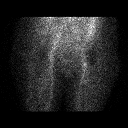
[frame 24/40  full-range]
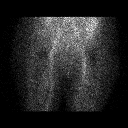
[frame 30/40  full-range]
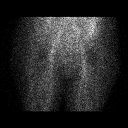
[frame 37/40  full-range]
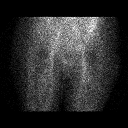

[Series 1: flow · 4.14mm/px · 6 of 40 frames shown (2 of 2)]
[frame 4/40  full-range]
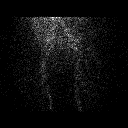
[frame 10/40  full-range]
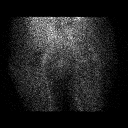
[frame 17/40  full-range]
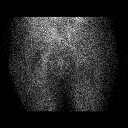
[frame 24/40  full-range]
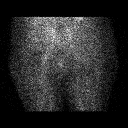
[frame 30/40  full-range]
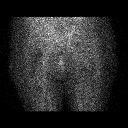
[frame 37/40  full-range]
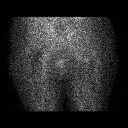

[Series 2: wbr_bone_60 blood pool · 2.07mm/px · 1 of 1 slices shown (1 of 8)]
[im 1/1]
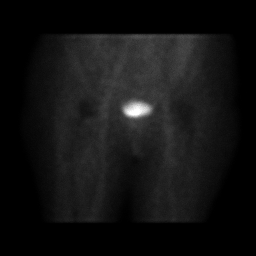

[Series 2: wbr_bone_60 blood pool · 2.07mm/px · 1 of 1 slices shown (2 of 8)]
[im 1/1]
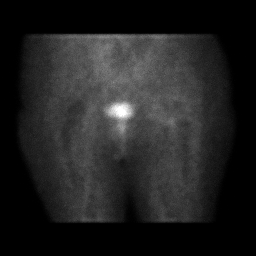

[Series 3: wbr_bone_60 blood pool · 2.07mm/px · 1 of 1 slices shown (3 of 8)]
[im 1/1]
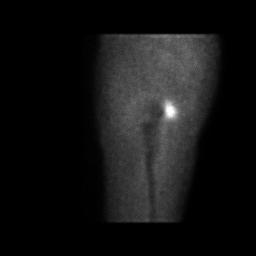

[Series 3: wbr_bone_60 blood pool · 2.07mm/px · 1 of 1 slices shown (4 of 8)]
[im 1/1]
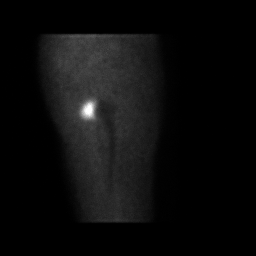

[Series 4: wbr_bone_60 blood pool · 2.07mm/px · 1 of 1 slices shown (5 of 8)]
[im 1/1]
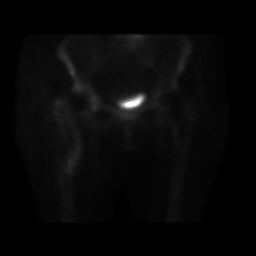

[Series 4: wbr_bone_60 blood pool · 2.07mm/px · 1 of 1 slices shown (6 of 8)]
[im 1/1]
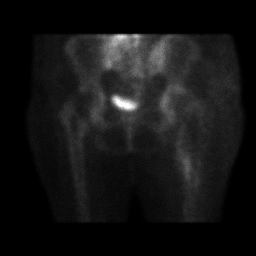

[Series 5: wbr_bone_60 blood pool · 2.07mm/px · 1 of 1 slices shown (7 of 8)]
[im 1/1]
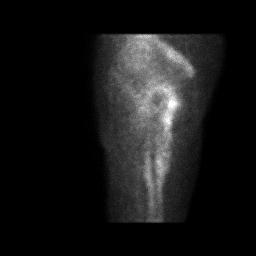

[Series 5: wbr_bone_60 blood pool · 2.07mm/px · 1 of 1 slices shown (8 of 8)]
[im 1/1]
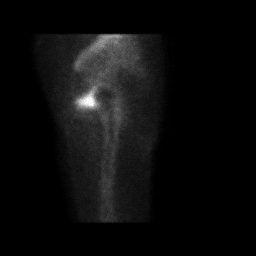

[20 of 20 positions shown; findings below may reference images not displayed]

FINDINGS: Vascular phase: No asymmetric or increased blood flow to the left or
right hip.

Blood pool phase: No asymmetric or increased blood pool activity.

Delayed phase: There is no significant abnormal radiotracer uptake
to suggest loosening of the left or right prosthetic. There is
uptake along the medial aspect of the proximal right femur which
correlates with heterotopic ossification on plain film.
IMPRESSION: No evidence of loosening or infection of left right hip prosthetic.

## 2016-07-29 IMAGING — MR MR LUMBAR SPINE W/O CM
4 of 5 series · 18 of 48 positions shown · non-contrast
Comparison: Abdomen radiograph dated 09/05/2005

CLINICAL DATA: Low back pain and right leg pain.

EXAM:
MRI LUMBAR SPINE WITHOUT CONTRAST
TECHNIQUE: Multiplanar, multisequence MR imaging of the lumbar spine was
performed. No intravenous contrast was administered.

[Series 3: T1 · sagittal · 4.0mm · 0.59mm/px · 3 of 20 slices shown (1 of 2)]
[im 4/20]
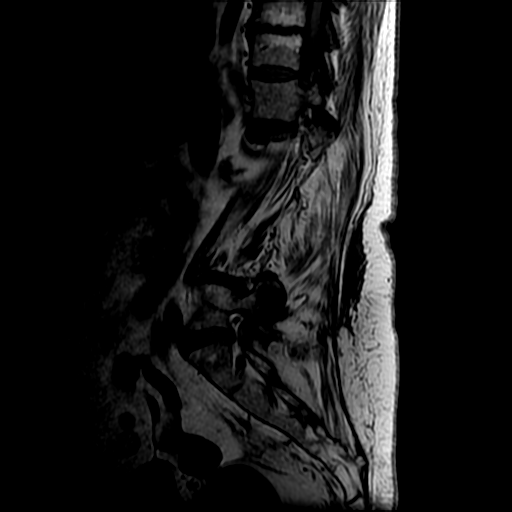
[im 12/20]
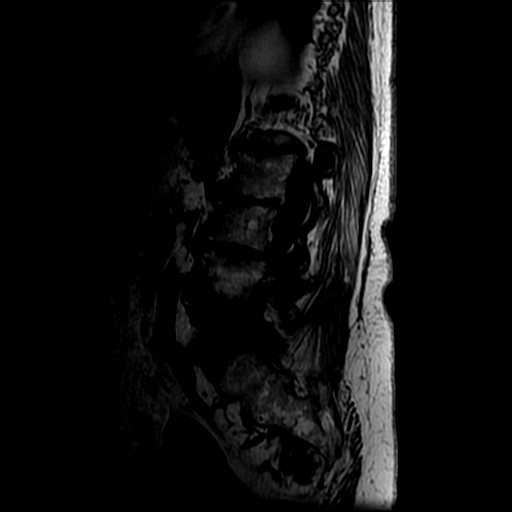
[im 20/20]
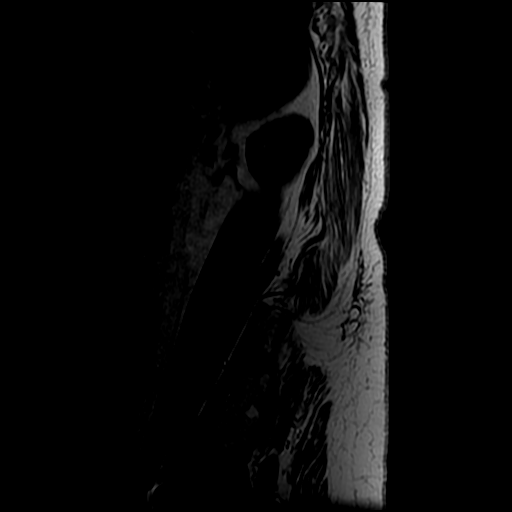

[Series 5: T2 post-contrast · sagittal · 4.0mm · 0.59mm/px · 7 of 20 slices shown]
[im 1/20]
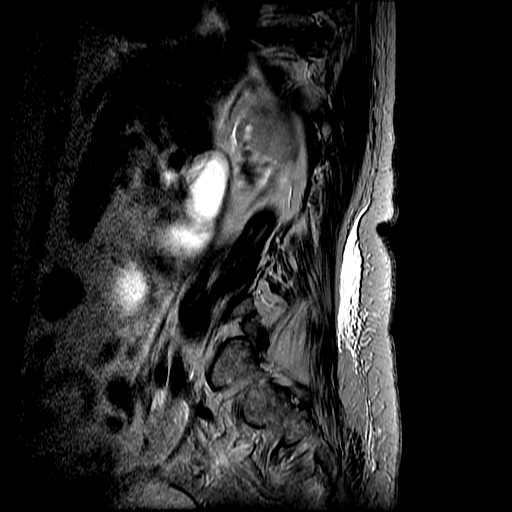
[im 4/20]
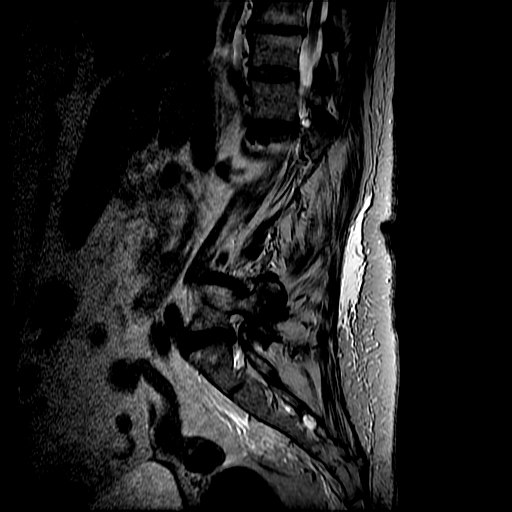
[im 7/20]
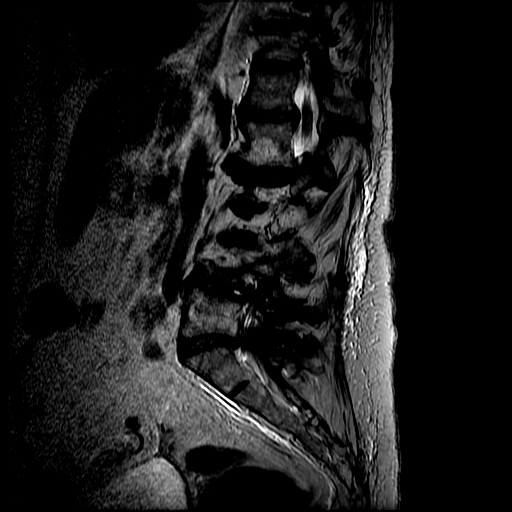
[im 10/20]
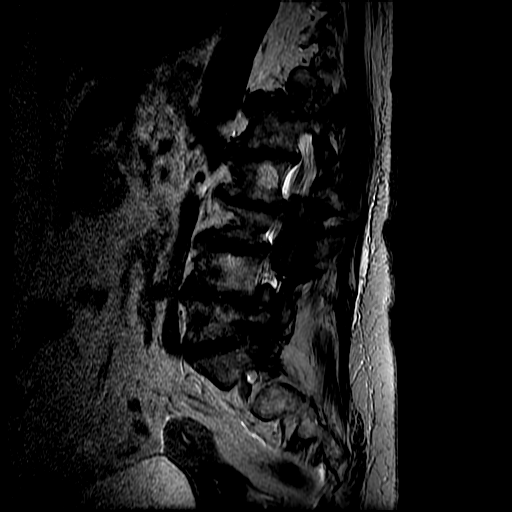
[im 13/20]
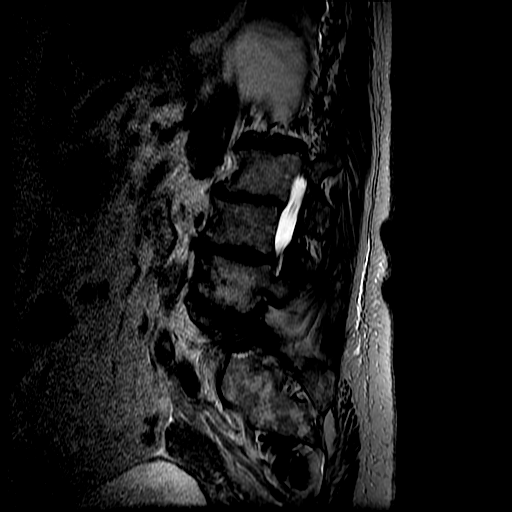
[im 16/20]
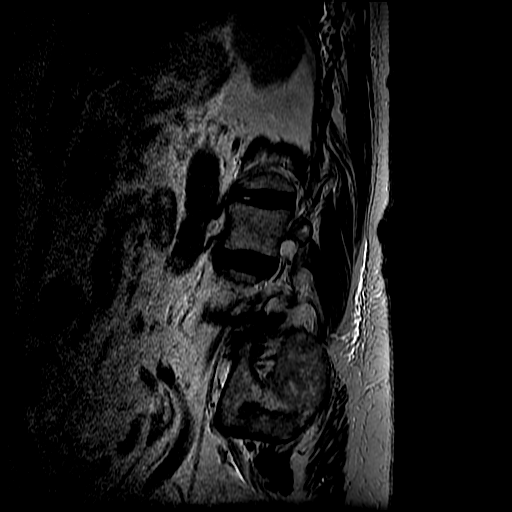
[im 20/20]
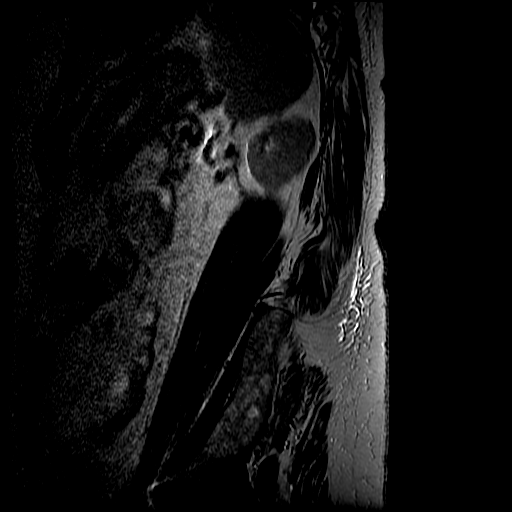

[Series 7: T2 · axial · 4.0mm · 0.41mm/px · z∈[+21,+198]mm · 5 of 43 slices shown]
[im 1/43]
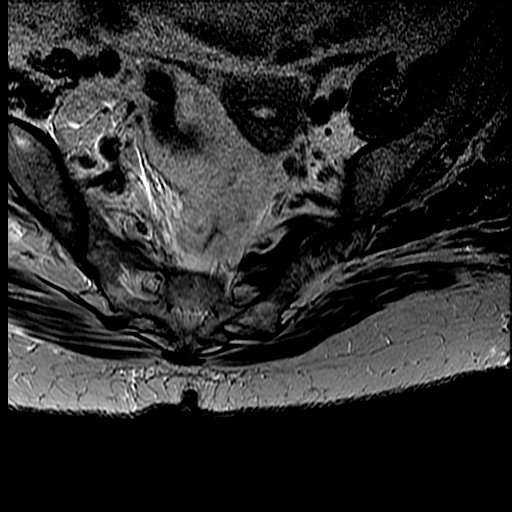
[im 7/43]
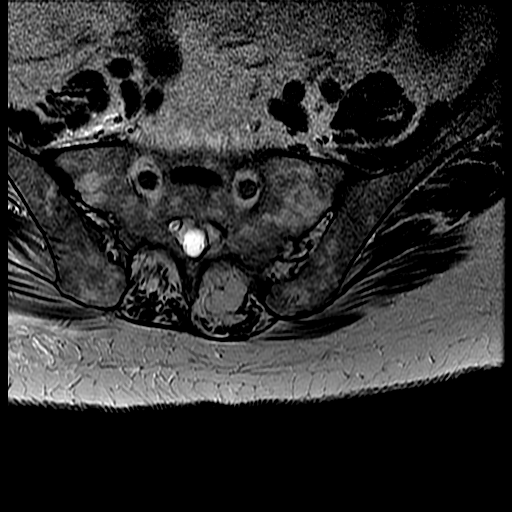
[im 13/43]
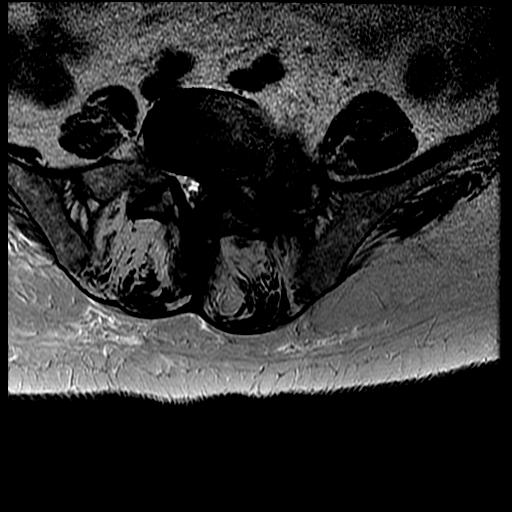
[im 23/43]
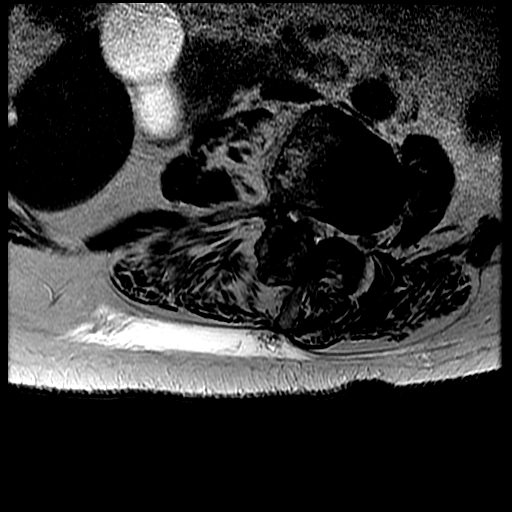
[im 36/43]
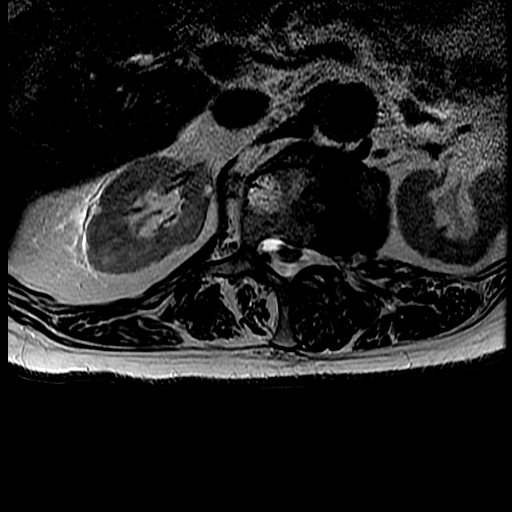

[Series 8: T1 · axial · 4.0mm · 0.41mm/px · z∈[+51,+198]mm · 3 of 43 slices shown (2 of 2)]
[im 7/43]
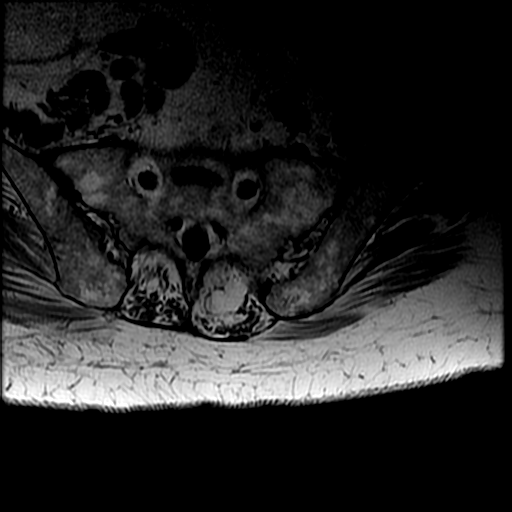
[im 23/43]
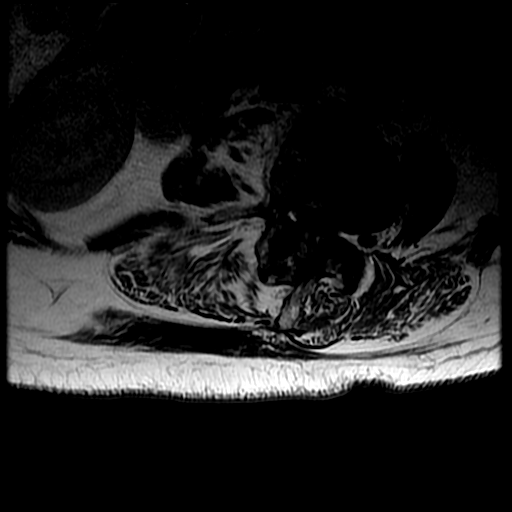
[im 36/43]
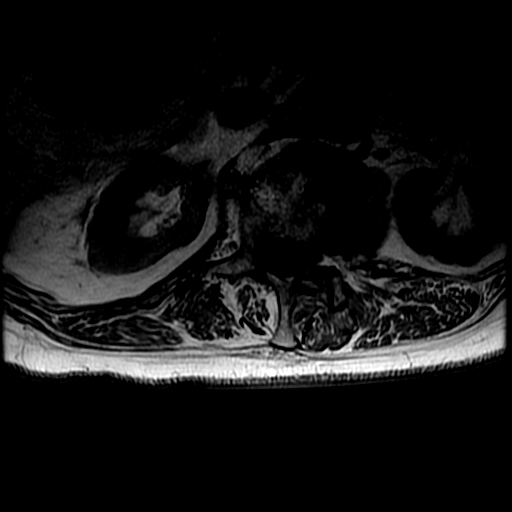

[18 of 48 positions shown; findings below may reference images not displayed]

FINDINGS: Conus tip is at L2-3. Fairly severe lumbar scoliosis with convexity
to the left centered at L2-3, approximately 50 degrees. Paraspinal
soft tissues are normal. Tiny stones in the otherwise normal
appearing gallbladder. Small cysts in both kidneys.

T11-12: Normal.

T12-L1: Small broad-based disc bulge with hypertrophy of the
ligamentum flavum and facet joints with narrowing of the left
lateral recess which could affect the left T12 and L1 nerves. Fairly
severe left foraminal stenosis.

L1-2: Broad-based disc bulge with accompanying osteophytes with
hypertrophy of the right facet joint and ligamentum flavum with the
right lateral recess stenosis and severe right foraminal stenosis.
This could affect the right L1 and L2 nerves.

L2-3: Disc space narrowing with osteophytes narrowing the right
lateral recess and right neural foramen severe right foraminal
stenosis. Hypertrophy of the right facet joint and ligamentum flavum
create the stenosis of the foramen. This could affect the right L2
nerve.

L3-4: Disc bulge and osteophytes create severe right foraminal
stenosis. Hypertrophy of the ligamentum flavum facet joints Create
moderately severe spinal stenosis and severe right lateral recess
stenosis. The findings could affect the right L3 and L4 nerves.

L4-5: Left lateral subluxation of L4 on L5 and disc space narrowing
as well as hypertrophy of the facet joints combine to create very
severe spinal stenosis with almost complete occlusion of the spinal
canal. The left superior facet of L5 occludes most of the spinal
canal. Small broad-based disc protrusion and accompanying
osteophytes occlude most of the rest of the canal. Severe right and
left foraminal stenosis.

L5-S1: 5 mm retrolisthesis of L5 on S1. Marked hypertrophy of the
left facet joint and ligamentum flavum severely compress the thecal
sac and left lateral recess. Severe left foraminal stenosis. The
left S1 nerve is also compressed.
IMPRESSION: 1. Severe spinal stenosis at L4-5 with almost complete obliteration
of the spinal canal.
2. Severe left foraminal stenosis and lateral recess stenosis at
L5-S1.
3. Diffuse degenerative disc and joint disease throughout the lumbar
spine create neural impingement at each level as described above.

## 2016-09-04 DIAGNOSIS — H353131 Nonexudative age-related macular degeneration, bilateral, early dry stage: Secondary | ICD-10-CM | POA: Diagnosis not present

## 2016-09-04 DIAGNOSIS — H348312 Tributary (branch) retinal vein occlusion, right eye, stable: Secondary | ICD-10-CM | POA: Diagnosis not present

## 2016-09-04 DIAGNOSIS — H43811 Vitreous degeneration, right eye: Secondary | ICD-10-CM | POA: Diagnosis not present

## 2016-09-04 DIAGNOSIS — H35363 Drusen (degenerative) of macula, bilateral: Secondary | ICD-10-CM | POA: Diagnosis not present

## 2016-10-15 ENCOUNTER — Other Ambulatory Visit: Payer: Self-pay | Admitting: Internal Medicine

## 2016-10-15 ENCOUNTER — Ambulatory Visit
Admission: RE | Admit: 2016-10-15 | Discharge: 2016-10-15 | Disposition: A | Discharge status: Home / Self Care | Payer: Medicare Other | Source: Ambulatory Visit | Attending: Internal Medicine | Admitting: Internal Medicine

## 2016-10-15 DIAGNOSIS — R0609 Other forms of dyspnea: Secondary | ICD-10-CM | POA: Diagnosis not present

## 2016-10-15 DIAGNOSIS — R05 Cough: Secondary | ICD-10-CM | POA: Diagnosis not present

## 2016-10-15 DIAGNOSIS — R5383 Other fatigue: Secondary | ICD-10-CM | POA: Diagnosis not present

## 2016-10-15 DIAGNOSIS — I129 Hypertensive chronic kidney disease with stage 1 through stage 4 chronic kidney disease, or unspecified chronic kidney disease: Secondary | ICD-10-CM | POA: Diagnosis not present

## 2016-10-15 DIAGNOSIS — E039 Hypothyroidism, unspecified: Secondary | ICD-10-CM | POA: Diagnosis not present

## 2016-10-15 DIAGNOSIS — N183 Chronic kidney disease, stage 3 (moderate): Secondary | ICD-10-CM | POA: Diagnosis not present

## 2016-10-15 DIAGNOSIS — R059 Cough, unspecified: Secondary | ICD-10-CM

## 2016-10-17 ENCOUNTER — Ambulatory Visit (HOSPITAL_COMMUNITY): Payer: Medicare Other | Attending: Cardiovascular Disease

## 2016-10-17 ENCOUNTER — Other Ambulatory Visit: Payer: Self-pay

## 2016-10-17 DIAGNOSIS — N189 Chronic kidney disease, unspecified: Secondary | ICD-10-CM | POA: Insufficient documentation

## 2016-10-17 DIAGNOSIS — I4891 Unspecified atrial fibrillation: Secondary | ICD-10-CM | POA: Diagnosis not present

## 2016-10-17 DIAGNOSIS — R0609 Other forms of dyspnea: Secondary | ICD-10-CM | POA: Insufficient documentation

## 2016-10-17 DIAGNOSIS — I129 Hypertensive chronic kidney disease with stage 1 through stage 4 chronic kidney disease, or unspecified chronic kidney disease: Secondary | ICD-10-CM | POA: Insufficient documentation

## 2016-10-17 DIAGNOSIS — I083 Combined rheumatic disorders of mitral, aortic and tricuspid valves: Secondary | ICD-10-CM | POA: Insufficient documentation

## 2016-11-08 DIAGNOSIS — H353130 Nonexudative age-related macular degeneration, bilateral, stage unspecified: Secondary | ICD-10-CM | POA: Diagnosis not present

## 2016-11-08 DIAGNOSIS — H04123 Dry eye syndrome of bilateral lacrimal glands: Secondary | ICD-10-CM | POA: Diagnosis not present

## 2016-11-08 DIAGNOSIS — H35033 Hypertensive retinopathy, bilateral: Secondary | ICD-10-CM | POA: Diagnosis not present

## 2016-11-08 DIAGNOSIS — H40013 Open angle with borderline findings, low risk, bilateral: Secondary | ICD-10-CM | POA: Diagnosis not present

## 2016-11-18 DIAGNOSIS — I129 Hypertensive chronic kidney disease with stage 1 through stage 4 chronic kidney disease, or unspecified chronic kidney disease: Secondary | ICD-10-CM | POA: Diagnosis not present

## 2016-11-18 DIAGNOSIS — Z23 Encounter for immunization: Secondary | ICD-10-CM | POA: Diagnosis not present

## 2016-11-18 DIAGNOSIS — N183 Chronic kidney disease, stage 3 (moderate): Secondary | ICD-10-CM | POA: Diagnosis not present

## 2017-01-01 ENCOUNTER — Encounter: Payer: Self-pay | Admitting: Cardiology

## 2017-01-01 ENCOUNTER — Ambulatory Visit (INDEPENDENT_AMBULATORY_CARE_PROVIDER_SITE_OTHER): Payer: Medicare Other | Admitting: Cardiology

## 2017-01-01 VITALS — BP 132/74 | HR 75 | Ht 62.0 in | Wt 166.0 lb

## 2017-01-01 DIAGNOSIS — I4819 Other persistent atrial fibrillation: Secondary | ICD-10-CM

## 2017-01-01 DIAGNOSIS — I119 Hypertensive heart disease without heart failure: Secondary | ICD-10-CM

## 2017-01-01 DIAGNOSIS — I481 Persistent atrial fibrillation: Secondary | ICD-10-CM

## 2017-01-01 DIAGNOSIS — I272 Pulmonary hypertension, unspecified: Secondary | ICD-10-CM

## 2017-01-01 NOTE — Patient Instructions (Signed)

## 2017-01-01 NOTE — Progress Notes (Signed)
Cardiology Office Note:    Date:  01/01/2017   ID:  [name], DOB 01-10-1924, MRN [id number]  PCP:  Lavone Orn, MD  Cardiologist:  Ena Dawley, MD   Referring MD: Lavone Orn, MD   Chief complaint: One-year follow-up  History of Present Illness:    [name] is a 83 y.o. female with a hx of chronic persistent a-fib on metoprolol and xarelto seen the last year for concern of murmur, fatigue and SOB. She refused to have a Lexiscan nuclear stress test, but underwent echocardiogram that showed mild AI, moderate TR, mild pulmonary HTN with RVSP 37 mmHg.  The patient still lived independently in a condo but feels down about slowing down and not being able to do what she used to do in the past.  She denies chest pain again, she is complaint with her meds. Recently she has noticed significant bruising on her arms and also in her old hip replacement scar.  Her crea increased 1.1 --> 1.44. Denies orthopnea, PND, falls, palpitations or syncope.   01/01/2017 - one year follow-up, the patient continues to be fully independent with minimal symptoms of dyspnea on exertion. She goes to the [facility] 3 times a week where she walks. She denies any bleeding, no falls. No chest pain lower extremity edema orthopnea, no orthostasis.  Past Medical History:  Diagnosis Date  . Allergic cough   . Arthritis   . Bronchitis, chronic with acute exacerbation ([place]) 04/25/2011  . Chronic atrial fibrillation (HCC) 12/13   normal EF, severe tricuspid reguritation  . Facial neuralgia    atypical  . GERD (gastroesophageal reflux disease)   . Heart murmur   . History of PSVT (paroxysmal supraventricular tachycardia)   . Hx of blood clots 60's   eye rt  . Hyperplastic colon polyp   . Hypertension   . Hypothyroidism   . Macular degeneration of left eye   . Osteoporosis    fosamax 10 yrs, reclast 3 yrs  . Peripheral vascular disease ([place])    blood clot behind rt eye   . Primary hyperparathyroidism  ([place]) 2012  . PVC's (premature ventricular contractions)    history of  . Scoliosis   . Stage III chronic kidney disease ([place])   . Thyroid nodule   . Varicose veins     Past Surgical History:  Procedure Laterality Date  . CATARACT EXTRACTION, BILATERAL    . REVERSE SHOULDER ARTHROPLASTY Right 11/201/2014   Dr Onnie Graham  . TOTAL HIP ARTHROPLASTY  1981,1982,2001,2007,   Right and Left    Current Medications: Current Meds  Medication Sig  . apixaban (ELIQUIS) 2.5 MG TABS tablet Take 1 tablet (2.5 mg total) by mouth 2 (two) times daily.  . Ascorbic Acid (VITAMIN C) 1000 MG tablet Take 1,000 mg by mouth daily.   . calcium citrate-vitamin D (CITRACAL+D) 315-200 MG-UNIT per tablet Take 1 tablet by mouth daily.   . Cyanocobalamin (B-12) 1000 MCG TBCR Take 1 tablet by mouth daily.   . ergocalciferol (VITAMIN D2) 50000 UNITS capsule Take 50,000 Units by mouth every 30 (thirty) days.   Marland Kitchen levothyroxine (SYNTHROID, LEVOTHROID) 100 MCG tablet Take 100 mcg by mouth daily.   . metoprolol (LOPRESSOR) 50 MG tablet Take 1 tablet by mouth 2 (two) times daily.  . Multiple Vitamin (MULTIVITAMIN) tablet Take 1 tablet by mouth daily.    . NON FORMULARY Take 1 tablet by mouth 2 (two) times daily. Macular Degeneration Supp. Take 1 tab by mouth twice daily.  Marland Kitchen  omeprazole (PRILOSEC) 20 MG capsule Take 20 mg by mouth every other day.   . Polyvinyl Alcohol-Povidone (REFRESH OP) Apply 1 drop to eye 2 (two) times daily.     Allergies:   Amlodipine besylate; Lodine [etodolac]; Naprosyn [naproxen]; Penicillins; Pneumovax [pneumococcal polysaccharide vaccine]; Robitussin dm [dextromethorphan-guaifenesin]; and Sulfonamide derivatives   Social History   Socioeconomic History  . Marital status: Widowed    Spouse name: None  . Number of children: 3  . Years of education: None  . Highest education level: None  Social Needs  . Financial resource strain: None  . Food insecurity - worry: None  . Food insecurity -  inability: None  . Transportation needs - medical: None  . Transportation needs - non-medical: None  Occupational History  . 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 Smoking status: Never Smoker  . Smokeless tobacco: Never Used  Substance and Sexual Activity  . Alcohol use: No  . Drug use: No  . Sexual activity: None  Other Topics Concern  . None  Social History Narrative   Husband deceased of colon cancer. Lives alone.  Ambulates with a cane.  Has a walker.     Family History: The patient's family history includes Breast cancer in her daughter; Heart attack in her brother; Lung cancer in her father and sister; Rheum arthritis in her father; Stroke in her mother. ROS:   Please see the history of present illness.     All other systems reviewed and are negative.  EKGs/Labs/Other Studies Reviewed:    The following studies were reviewed today:   EKG:  EKG is  ordered today.  The ekg ordered today demonstrates atrial fibrillation, low-voltage QRS, unchanged from prior.  Recent Labs: No results found for requested labs within last 8760 hours.  Recent Lipid Panel No results found for: CHOL, TRIG, HDL, CHOLHDL, VLDL, LDLCALC, LDLDIRECT  Physical Exam:    VS:  BP 132/74   Pulse 75   Ht 5\' 2"  (1.575 m)   Wt 166 lb (75.3 kg)   SpO2 97%   BMI 30.36 kg/m     Wt Readings from Last 3 Encounters:  01/01/17 166 lb (75.3 kg)  12/06/15 165 lb 6.4 oz (75 kg)  11/18/14 158 lb (71.7 kg)     GEN: Well nourished, well developed in no acute distress HEENT: Normal NECK: No JVD; No carotid bruits LYMPHATICS: No lymphadenopathy CARDIAC: RRR, 3/6 systolic murmur, rubs, gallops RESPIRATORY:  Clear to auscultation without rales, wheezing or rhonchi  ABDOMEN: Soft, non-tender, non-distended MUSCULOSKELETAL:  No edema; No deformity  SKIN: Warm and dry NEUROLOGIC:  Alert and oriented x 3 PSYCHIATRIC:  Normal affect   ASSESSMENT:     1. Persistent atrial fibrillation ([place])   2. Hypertensive heart disease without heart failure   3. Pulmonary hypertension ([place])    PLAN:    In order of problems listed above:  1. Hypertensive heart disease without CHF - the patient is well compensated with normal blood pressure.  2. Pulmonary hypertension  - mild with RVSP 39 mmHg on most recent echocardiogram in August 2018. She is minimally symptomatic and fully independent.  3. A-fib - chronic persistent, rate controlled, on Eliquis 2.5 mg po BID.  she has no bleeding or falls.  4. Murmur - mild AI, mild MR, moderate TR, no further therapy needed, unchanged from prior echo.  Medication Adjustments/Labs and Tests Ordered: Current medicines are reviewed at  length with the patient today.  Concerns regarding medicines are outlined above.  Orders Placed This Encounter  Procedures  . EKG 12-Lead   No orders of the defined types were placed in this encounter.   Signed, Ena Dawley, MD  01/01/2017 10:28 AM    [place]

## 2017-01-10 ENCOUNTER — Other Ambulatory Visit: Payer: Self-pay | Admitting: Cardiology

## 2017-01-10 DIAGNOSIS — I4819 Other persistent atrial fibrillation: Secondary | ICD-10-CM

## 2017-01-13 NOTE — Telephone Encounter (Signed)
Called PCP Dr Laurann Montana for updated labs for Eliquis refill: 10/15/2016 - SCr 1.47, 06/2016 - SCr 1.39. SCr very close to 1.5 and age is > 73, ok to continue with 2.5mg  BID dosing.

## 2017-01-31 ENCOUNTER — Telehealth: Payer: Self-pay | Admitting: Cardiology

## 2017-01-31 DIAGNOSIS — I4819 Other persistent atrial fibrillation: Secondary | ICD-10-CM

## 2017-01-31 MED ORDER — APIXABAN 2.5 MG PO TABS: ORAL_TABLET | ORAL | 1 refills | Status: DC

## 2017-01-31 NOTE — Telephone Encounter (Signed)
Pts Son (on [place]) is calling to request only a month supply of Eliquis to be sent to [facility].  Per the pts Son, she is currently in the doughnut hole and it will cost $400 for a 3 month supply of her Eliquis.  Pts Son is requesting for one of our Prior Auth Nurses to aid in helping lowering the cost of this medication through pts insurance carrier, or aid in helping them apply for pt assistance for her Eliquis.  Informed the pts Son that we have 3 prior auth nurses in our office who will be able to assist with this request.  Informed the pts Son that I will send them all a message for follow-up of patient assistance request for Eliquis.  Confirmed the pharmacy of choice with the pts Son, to send the month supply of Eliquis to.  Son verbalized understanding and agrees with this plan.

## 2017-01-31 NOTE — Telephone Encounter (Signed)
[name] is calling to find out if there is a generic for Eliquis  2.5mg  tablet . Because when his mom has to go get it , it costs $400 . Please call

## 2017-02-05 ENCOUNTER — Other Ambulatory Visit: Payer: Self-pay

## 2017-02-05 DIAGNOSIS — I4819 Other persistent atrial fibrillation: Secondary | ICD-10-CM

## 2017-02-05 MED ORDER — APIXABAN 2.5 MG PO TABS: ORAL_TABLET | ORAL | 3 refills | Status: DC

## 2017-02-05 NOTE — Telephone Encounter (Signed)
Spoke with patient today. Advised her I would be mailing her an application for patient assistance through [employer]. She expressed gratitude.

## 2017-02-06 NOTE — Telephone Encounter (Signed)
Received [place] patient assistance application signed by Dr Meda Coffee for patients [name]. Will place in yellow folder and await for patient to complete her part.

## 2017-02-21 DIAGNOSIS — I10 Essential (primary) hypertension: Secondary | ICD-10-CM | POA: Diagnosis not present

## 2017-02-21 DIAGNOSIS — E039 Hypothyroidism, unspecified: Secondary | ICD-10-CM | POA: Diagnosis not present

## 2017-02-21 DIAGNOSIS — I480 Paroxysmal atrial fibrillation: Secondary | ICD-10-CM | POA: Diagnosis not present

## 2017-02-21 DIAGNOSIS — M810 Age-related osteoporosis without current pathological fracture: Secondary | ICD-10-CM | POA: Diagnosis not present

## 2017-02-21 DIAGNOSIS — I481 Persistent atrial fibrillation: Secondary | ICD-10-CM | POA: Diagnosis not present

## 2017-02-21 DIAGNOSIS — N183 Chronic kidney disease, stage 3 (moderate): Secondary | ICD-10-CM | POA: Diagnosis not present

## 2017-02-24 DIAGNOSIS — I10 Essential (primary) hypertension: Secondary | ICD-10-CM | POA: Diagnosis not present

## 2017-02-24 DIAGNOSIS — R600 Localized edema: Secondary | ICD-10-CM | POA: Diagnosis not present

## 2017-05-13 DIAGNOSIS — H40013 Open angle with borderline findings, low risk, bilateral: Secondary | ICD-10-CM | POA: Diagnosis not present

## 2017-06-10 DIAGNOSIS — I129 Hypertensive chronic kidney disease with stage 1 through stage 4 chronic kidney disease, or unspecified chronic kidney disease: Secondary | ICD-10-CM | POA: Diagnosis not present

## 2017-06-10 DIAGNOSIS — E559 Vitamin D deficiency, unspecified: Secondary | ICD-10-CM | POA: Diagnosis not present

## 2017-06-10 DIAGNOSIS — I481 Persistent atrial fibrillation: Secondary | ICD-10-CM | POA: Diagnosis not present

## 2017-06-10 DIAGNOSIS — K219 Gastro-esophageal reflux disease without esophagitis: Secondary | ICD-10-CM | POA: Diagnosis not present

## 2017-06-10 DIAGNOSIS — Z1389 Encounter for screening for other disorder: Secondary | ICD-10-CM | POA: Diagnosis not present

## 2017-06-10 DIAGNOSIS — Z0000 Encounter for general adult medical examination without abnormal findings: Secondary | ICD-10-CM | POA: Diagnosis not present

## 2017-06-10 DIAGNOSIS — J069 Acute upper respiratory infection, unspecified: Secondary | ICD-10-CM | POA: Diagnosis not present

## 2017-06-10 DIAGNOSIS — N183 Chronic kidney disease, stage 3 (moderate): Secondary | ICD-10-CM | POA: Diagnosis not present

## 2017-06-10 DIAGNOSIS — E039 Hypothyroidism, unspecified: Secondary | ICD-10-CM | POA: Diagnosis not present

## 2017-06-10 DIAGNOSIS — E210 Primary hyperparathyroidism: Secondary | ICD-10-CM | POA: Diagnosis not present

## 2017-06-19 DIAGNOSIS — N179 Acute kidney failure, unspecified: Secondary | ICD-10-CM | POA: Diagnosis not present

## 2017-06-19 DIAGNOSIS — Z5181 Encounter for therapeutic drug level monitoring: Secondary | ICD-10-CM | POA: Diagnosis not present

## 2017-07-18 DIAGNOSIS — R040 Epistaxis: Secondary | ICD-10-CM | POA: Diagnosis not present

## 2017-07-25 ENCOUNTER — Other Ambulatory Visit: Payer: Self-pay | Admitting: Internal Medicine

## 2017-07-25 ENCOUNTER — Ambulatory Visit
Admission: RE | Admit: 2017-07-25 | Discharge: 2017-07-25 | Disposition: A | Discharge status: Home / Self Care | Payer: Medicare Other | Source: Ambulatory Visit | Attending: Internal Medicine | Admitting: Internal Medicine

## 2017-07-25 DIAGNOSIS — M25552 Pain in left hip: Secondary | ICD-10-CM

## 2017-07-25 DIAGNOSIS — Z471 Aftercare following joint replacement surgery: Secondary | ICD-10-CM | POA: Diagnosis not present

## 2017-07-25 DIAGNOSIS — Z96642 Presence of left artificial hip joint: Secondary | ICD-10-CM | POA: Diagnosis not present

## 2017-07-29 DIAGNOSIS — M7062 Trochanteric bursitis, left hip: Secondary | ICD-10-CM | POA: Diagnosis not present

## 2017-07-30 ENCOUNTER — Other Ambulatory Visit: Payer: Self-pay | Admitting: Cardiology

## 2017-07-30 DIAGNOSIS — I4819 Other persistent atrial fibrillation: Secondary | ICD-10-CM

## 2017-07-31 NOTE — Telephone Encounter (Signed)
Age 84 years Wt 75.3 kg 01/01/2017 Saw Dr Meda Coffee 01/01/2017 SrCr 1.47 10/15/2016  Spoke with [place] PharmD regarding dosing Eliquis 2.5mg  q 12 hours refilled as requested

## 2017-08-02 ENCOUNTER — Encounter (HOSPITAL_COMMUNITY): Payer: Self-pay | Admitting: *Deleted

## 2017-08-02 ENCOUNTER — Emergency Department (HOSPITAL_COMMUNITY)
Admission: EM | Admit: 2017-08-02 | Discharge: 2017-08-02 | Disposition: A | Discharge status: Home / Self Care | Payer: Medicare Other | Attending: Emergency Medicine | Admitting: Emergency Medicine

## 2017-08-02 DIAGNOSIS — Z7901 Long term (current) use of anticoagulants: Secondary | ICD-10-CM | POA: Diagnosis not present

## 2017-08-02 DIAGNOSIS — R040 Epistaxis: Secondary | ICD-10-CM

## 2017-08-02 DIAGNOSIS — Z96643 Presence of artificial hip joint, bilateral: Secondary | ICD-10-CM | POA: Insufficient documentation

## 2017-08-02 DIAGNOSIS — N183 Chronic kidney disease, stage 3 (moderate): Secondary | ICD-10-CM | POA: Diagnosis not present

## 2017-08-02 DIAGNOSIS — E039 Hypothyroidism, unspecified: Secondary | ICD-10-CM | POA: Insufficient documentation

## 2017-08-02 DIAGNOSIS — I1310 Hypertensive heart and chronic kidney disease without heart failure, with stage 1 through stage 4 chronic kidney disease, or unspecified chronic kidney disease: Secondary | ICD-10-CM | POA: Diagnosis not present

## 2017-08-02 DIAGNOSIS — Z79899 Other long term (current) drug therapy: Secondary | ICD-10-CM | POA: Diagnosis not present

## 2017-08-02 MED ORDER — OXYMETAZOLINE HCL 0.05 % NA SOLN
1.0000 | Freq: Once | NASAL | Status: AC
  Administered 2017-08-02: 1 via NASAL
  Filled 2017-08-02: qty 15

## 2017-08-02 NOTE — ED Notes (Signed)
Pt unable to sign for discharge due to signature pad not working in the room.

## 2017-08-02 NOTE — ED Triage Notes (Addendum)
Pt complains of intermittent nose bleeds since this morning. Pt's son called her PCP and was told to come to ED. Pt is on Eliquis.

## 2017-08-02 NOTE — ED Provider Notes (Signed)
[facility] DEPT Provider Note   CSN: [id number] Arrival date & time: 08/02/17  1357     History   Chief Complaint Chief Complaint  Patient presents with  . Epistaxis    HPI [name] is a 84 y.o. female.  84yo F w/ PMH including A fib on Eliquis, HTN, CKD who p/w nose bleed. Around 5am today, her R nose started bleeding and stopped after ~45 min. Started back again around 8am, lasted an hour. It has continued to intermittently bleed throughout the day today. No cough/cold/runny nose. No h/o ENT surgery. No medications or therapies tried aside from direct pressure and afrin around 10am.  The history is provided by the patient and a relative.  Epistaxis      Past Medical History:  Diagnosis Date  . Allergic cough   . Arthritis   . Bronchitis, chronic with acute exacerbation ([place]) 04/25/2011  . Chronic atrial fibrillation (HCC) 12/13   normal EF, severe tricuspid reguritation  . Facial neuralgia    atypical  . GERD (gastroesophageal reflux disease)   . Heart murmur   . History of PSVT (paroxysmal supraventricular tachycardia)   . Hx of blood clots 60's   eye rt  . Hyperplastic colon polyp   . Hypertension   . Hypothyroidism   . Macular degeneration of left eye   . Osteoporosis    fosamax 10 yrs, reclast 3 yrs  . Peripheral vascular disease ([place])    blood clot behind rt eye   . Primary hyperparathyroidism ([place]) 2012  . PVC's (premature ventricular contractions)    history of  . Scoliosis   . Stage III chronic kidney disease ([place])   . Thyroid nodule   . Varicose veins     Patient Active Problem List   Diagnosis Date Noted  . Hip pain 06/13/2014  . Stage III chronic kidney disease ([place]) 06/13/2014  . Essential hypertension 11/19/2013  . Atrial fibrillation ([place]) 11/19/2013  . Hypothyroidism 11/19/2013  . DOE (dyspnea on exertion) 11/19/2013  . Fatigue 11/19/2013  . Murmur 11/19/2013  . Chronic cough 07/26/2010  .  Constipation 01/20/2008  . CHANGE IN BOWELS 01/20/2008  . GERD 01/19/2008  . OSTEOPOROSIS 01/19/2008    Past Surgical History:  Procedure Laterality Date  . CATARACT EXTRACTION, BILATERAL    . REVERSE SHOULDER ARTHROPLASTY Right 11/201/2014   Dr Onnie Graham  . REVERSE SHOULDER ARTHROPLASTY Right 01/07/2013   Procedure: RIGHT REVERSE SHOULDER ARTHROPLASTY;  Surgeon: Marin Shutter, MD;  Location: [facility];  Service: Orthopedics;  Laterality: Right;  . TOTAL HIP ARTHROPLASTY  1981,1982,2001,2007,   Right and Left     OB History   None      Home Medications    Prior to Admission medications   Medication Sig Start Date End Date Taking? Authorizing Provider  calcium citrate-vitamin D (CITRACAL+D) 315-200 MG-UNIT per tablet Take 1 tablet by mouth daily.    Yes [provider]  ELIQUIS 2.5 MG TABS tablet TAKE 1 TABLET BY MOUTH TWICE DAILY 07/31/17  Yes Dorothy Spark, MD  ergocalciferol (VITAMIN D2) 50000 UNITS capsule Take 50,000 Units by mouth every 30 (thirty) days.    Yes [provider]  hydrochlorothiazide (HYDRODIURIL) 25 MG tablet Take 25 mg by mouth daily. 07/03/17  Yes [provider]  losartan (COZAAR) 50 MG tablet Take 50 mg by mouth daily.   Yes [provider]  metoprolol succinate (TOPROL-XL) 100 MG 24 hr tablet Take 100 mg by mouth daily.  07/30/17  Yes [provider]  Multiple Vitamin (MULTIVITAMIN) tablet Take 1 tablet by mouth daily.     Yes [provider]  NON FORMULARY Take 1 tablet by mouth 2 (two) times daily. Macular Degeneration Supp. Take 1 tab by mouth twice daily.   Yes [provider]  Polyvinyl Alcohol-Povidone (REFRESH OP) Apply 1 drop to eye 2 (two) times daily.   Yes [provider]  traMADol (ULTRAM) 50 MG tablet Take 50 mg by mouth 2 (two) times daily. 07/26/17  Yes [provider]    Family History Family History  Problem Relation Age of Onset  . Stroke Mother   . Rheum  arthritis Father   . Lung cancer Father   . Breast cancer Daughter   . Heart attack Brother   . Lung cancer Sister     Social History Social History   Tobacco Use  . Smoking status: Never Smoker  . Smokeless tobacco: Never Used  Substance Use Topics  . Alcohol use: No  . Drug use: No     Allergies   Amlodipine besylate; Lodine [etodolac]; Naprosyn [naproxen]; Penicillins; Pneumovax [pneumococcal polysaccharide vaccine]; Robitussin dm [dextromethorphan-guaifenesin]; and Sulfonamide derivatives   Review of Systems Review of Systems  HENT: Positive for nosebleeds.   All other systems reviewed and are negative except that which was mentioned in HPI    Physical Exam Updated Vital Signs BP (!) 170/85   Pulse 68   Temp (!) 97.4 F (36.3 C) (Oral)   Resp 16   SpO2 99%   Physical Exam  Constitutional: She is oriented to person, place, and time. She appears well-developed and well-nourished. No distress.  HENT:  Head: Normocephalic and atraumatic.  Tiny arteriole bleed at anterior R nose with bleeding  Eyes: Conjunctivae are normal.  Neck: Neck supple.  Neurological: She is alert and oriented to person, place, and time.  Skin: Skin is warm and dry.  Psychiatric: She has a normal mood and affect. Judgment normal.  Nursing note and vitals reviewed.    ED Treatments / Results  Labs (all labs ordered are listed, but only abnormal results are displayed) Labs Reviewed - No data to display  EKG None  Radiology No results found.  Procedures .Epistaxis Management Date/Time: 08/02/2017 4:17 PM Performed by: Sharlett Iles, MD Authorized by: Sharlett Iles, MD   Consent:    Consent obtained:  Verbal   Consent given by:  Patient   Risks discussed:  Bleeding and pain Anesthesia (see MAR for exact dosages):    Anesthesia method:  None Procedure details:    Treatment site:  R anterior   Treatment method:  Silver nitrate   Treatment complexity:   Limited   Treatment episode: initial   Post-procedure details:    Assessment:  No improvement   Patient tolerance of procedure:  Tolerated well, no immediate complications   (including critical care time)  Medications Ordered in ED Medications  oxymetazoline (AFRIN) 0.05 % nasal spray 1 spray (1 spray Each Nare Given 08/02/17 1543)     Initial Impression / Assessment and Plan / ED Course  I have reviewed the triage vital signs and the nursing notes.     Anterior bleeding from small arteriole in R nose visible on exam. Applied silver nitrate and then topical wound seal, then held pressure for 30+ minutes.  On reassessment, bleeding had ceased and no blood in posterior oropharynx.  Observed patient for 30 more minutes during which time no further bleeding  occurred.  Discussed supportive measures including no nose blowing, sneezing through mouth, nasal saline as needed, and holding Eliquis dose tonight to ensure that bleeding has ceased.  Discussed ENT follow-up if she continues to have problems with recurrent nosebleeds.  Reviewed return precautions, discussed supportive measures at home if nosebleed recurs.  Patient and son voiced understanding.  Final Clinical Impressions(s) / ED Diagnoses   Final diagnoses:  Right-sided epistaxis    ED Discharge Orders    None       Little, Wenda Overland, MD 08/02/17 [phone]

## 2017-08-02 NOTE — Discharge Instructions (Signed)
No nose blowing. Hold dose of eliquis tonight and start again tomorrow. Follow up with ENT as needed for recurrent problems.  If you need to sneeze, try to sneeze as much through your mouth as you can.  If you have another nosebleed tonight, spray Afrin in your nose and then hold continuous pressure without letting go for 20 to 30 minutes.  If bleeding continues, return to ER.

## 2017-09-11 DIAGNOSIS — M8588 Other specified disorders of bone density and structure, other site: Secondary | ICD-10-CM | POA: Diagnosis not present

## 2017-09-11 DIAGNOSIS — M810 Age-related osteoporosis without current pathological fracture: Secondary | ICD-10-CM | POA: Diagnosis not present

## 2017-09-11 DIAGNOSIS — Z79899 Other long term (current) drug therapy: Secondary | ICD-10-CM | POA: Diagnosis not present

## 2017-11-07 DIAGNOSIS — M19011 Primary osteoarthritis, right shoulder: Secondary | ICD-10-CM | POA: Diagnosis not present

## 2017-11-07 DIAGNOSIS — Z96611 Presence of right artificial shoulder joint: Secondary | ICD-10-CM | POA: Diagnosis not present

## 2017-11-07 DIAGNOSIS — M25512 Pain in left shoulder: Secondary | ICD-10-CM | POA: Diagnosis not present

## 2017-11-11 DIAGNOSIS — H04123 Dry eye syndrome of bilateral lacrimal glands: Secondary | ICD-10-CM | POA: Diagnosis not present

## 2017-11-11 DIAGNOSIS — Z961 Presence of intraocular lens: Secondary | ICD-10-CM | POA: Diagnosis not present

## 2017-11-11 DIAGNOSIS — H353131 Nonexudative age-related macular degeneration, bilateral, early dry stage: Secondary | ICD-10-CM | POA: Diagnosis not present

## 2017-11-11 DIAGNOSIS — H40013 Open angle with borderline findings, low risk, bilateral: Secondary | ICD-10-CM | POA: Diagnosis not present

## 2017-11-21 DIAGNOSIS — M25512 Pain in left shoulder: Secondary | ICD-10-CM | POA: Diagnosis not present

## 2017-12-11 DIAGNOSIS — I10 Essential (primary) hypertension: Secondary | ICD-10-CM | POA: Diagnosis not present

## 2017-12-11 DIAGNOSIS — Z23 Encounter for immunization: Secondary | ICD-10-CM | POA: Diagnosis not present

## 2017-12-11 DIAGNOSIS — J3489 Other specified disorders of nose and nasal sinuses: Secondary | ICD-10-CM | POA: Diagnosis not present

## 2018-02-25 ENCOUNTER — Other Ambulatory Visit: Payer: Self-pay | Admitting: *Deleted

## 2018-02-25 DIAGNOSIS — I4819 Other persistent atrial fibrillation: Secondary | ICD-10-CM

## 2018-02-25 MED ORDER — APIXABAN 2.5 MG PO TABS: 2.5000 mg | ORAL_TABLET | Freq: Two times a day (BID) | ORAL | 0 refills | Status: DC

## 2018-02-25 NOTE — Telephone Encounter (Signed)
Pt is a 85 yr old female who last saw Dr. Meda Coffee 12/2016. Telephoned her daughter and scheduled an appt on 05/21/2018 first available in the afternoon. Weight was 75.3Kg on 12/2016. SCr on 09/11/17 was 1.7, will refill Eliquis 2.5mg  BID until f/u appt.

## 2018-03-05 ENCOUNTER — Other Ambulatory Visit: Payer: Self-pay | Admitting: Cardiology

## 2018-03-05 DIAGNOSIS — I4819 Other persistent atrial fibrillation: Secondary | ICD-10-CM

## 2018-04-01 DIAGNOSIS — Q828 Other specified congenital malformations of skin: Secondary | ICD-10-CM | POA: Diagnosis not present

## 2018-04-01 DIAGNOSIS — L821 Other seborrheic keratosis: Secondary | ICD-10-CM | POA: Diagnosis not present

## 2018-05-12 ENCOUNTER — Encounter: Payer: Self-pay | Admitting: *Deleted

## 2018-05-15 ENCOUNTER — Telehealth: Payer: Self-pay

## 2018-05-15 ENCOUNTER — Telehealth: Payer: Self-pay | Admitting: Cardiology

## 2018-05-15 NOTE — Telephone Encounter (Signed)

## 2018-05-15 NOTE — Telephone Encounter (Signed)
Please set up this appointment as telemedicine visit.  Thank you  Ena Dawley

## 2018-05-18 ENCOUNTER — Telehealth (INDEPENDENT_AMBULATORY_CARE_PROVIDER_SITE_OTHER): Payer: Medicare Other | Admitting: Cardiology

## 2018-05-18 VITALS — BP 120/63 | HR 63 | Ht 63.0 in | Wt 180.0 lb

## 2018-05-18 DIAGNOSIS — I38 Endocarditis, valve unspecified: Secondary | ICD-10-CM

## 2018-05-18 DIAGNOSIS — I272 Pulmonary hypertension, unspecified: Secondary | ICD-10-CM

## 2018-05-18 DIAGNOSIS — I10 Essential (primary) hypertension: Secondary | ICD-10-CM

## 2018-05-18 DIAGNOSIS — I482 Chronic atrial fibrillation, unspecified: Secondary | ICD-10-CM

## 2018-05-18 NOTE — Patient Instructions (Signed)
Medication Instructions:  NONE If you need a refill on your cardiac medications before your next appointment, please call your pharmacy.   Lab work: NONE If you have labs (blood work) drawn today and your tests are completely normal, you will receive your results only by: Marland Kitchen MyChart Message (if you have MyChart) OR . A paper copy in the mail If you have any lab test that is abnormal or we need to change your treatment, we will call you to review the results.  Testing/Procedures: NONE  Follow-Up: 6 MONTHS with Dr Meda Coffee At [facility], you and your health needs are our priority.  As part of our continuing mission to provide you with exceptional heart care, we have created designated Provider Care Teams.  These Care Teams include your primary Cardiologist (physician) and Advanced Practice Providers (APPs -  Physician Assistants and Nurse Practitioners) who all work together to provide you with the care you need, when you need it. .   Any Other Special Instructions Will Be Listed Below (If Applicable).

## 2018-05-18 NOTE — Progress Notes (Signed)
Virtual Visit via Telephone Note    Evaluation Performed:  Follow-up visit  This visit type was conducted due to national recommendations for restrictions regarding the COVID-19 Pandemic (e.g. social distancing).  This format is felt to be most appropriate for this patient at this time.  All issues noted in this document were discussed and addressed.  No physical exam was performed (except for noted visual exam findings with Video Visits).  Please refer to the patient's chart (MyChart message for video visits and phone note for telephone visits) for the patient's consent to telehealth for [facility].  Date:  05/18/2018   ID:  [name], DOB 1923/04/04, MRN [id number]  Patient Location:  Home  Provider location:   [facility]     PCP:  Lavone Orn, MD  Cardiologist:  Ena Dawley, MD  Electrophysiologist:  None   Chief Complaint: Routine follow-up for chronic atrial fibrillation, valvular heart disease and pulmonary hypertension.   History of Present Illness:    [name] is a 85 y.o. female who presents via audio/video conferencing for a telehealth visit today.  Pt's son participated in phone call.   The patient does not have symptoms concerning for COVID-19 infection (fever, chills, cough, or new shortness of breath).   She is a 85 year old female, followed by Dr. Meda Coffee, with a history of chronic atrial fibrillation, on anticoagulation with Eliquis.  Her afib has been rate controlled with metoprolol. She was referred in the past for murmur and also endorsed symptoms of fatigue and shortness of breath.  She refused to have a Lexiscan nuclear stress test but underwent an echocardiogram 10/17/16 that showed mild aortic insufficiency, moderate tricuspid regurgitation, mild pulmonary hypertension with RVSP 37 mmHg.  She also has stage III chronic kidney disease, history of PVCs, hypothyroidism and hypertension. She was last seen by Dr.  Meda Coffee November 2018 and was doing well from a cardiac standpoint.  She was still living independently at that time in a condo.   Since last OV, pt has been doing fairly well. She still lives a home but lives with son, who helps around the house. She walks with a cane. Main limitation is knee arthritis and stable chronic exertional dyspnea, mild. No resting dyspnea. Her son, reports that she has dementia. Her son and daughter help her with her medications and ensures that she takes them daily. Pt has not complained of any other cardiac symptoms, other than mild exertional dyspnea. No CP, syncope/ near syncope. She has been fully compliant w/ Eliquis. She had a nose bleed in June 2019 that required ED visit but no recurrence since. She also denies any further bleeding. No hematuria, hematochezia or melena. Denies falls.    Prior CV studies:   The following studies were reviewed today:  2D Echo 09/20/2016 Study Conclusions  - Left ventricle: The cavity size was normal. Wall thickness was   increased in a pattern of mild LVH. Systolic function was normal.   The estimated ejection fraction was in the range of 60% to 65%.   Wall motion was normal; there were no regional wall motion   abnormalities. - Aortic valve: There was mild regurgitation. - Mitral valve: Mildly calcified annulus. There was mild   regurgitation. - Right ventricle: Systolic function was mildly reduced. - Tricuspid valve: There was moderate regurgitation. - Pulmonary arteries: Systolic pressure was mildly to moderately   increased. PA peak pressure: 39 mm Hg (S).   Past Medical History:  Diagnosis Date   Allergic cough    Arthritis    Bronchitis, chronic with acute exacerbation (HCC) 04/25/2011   Chronic atrial fibrillation 12/13   normal EF, severe tricuspid reguritation   Facial neuralgia    atypical   GERD (gastroesophageal reflux disease)    Heart murmur    History of PSVT (paroxysmal supraventricular  tachycardia)    Hx of blood clots 60's   eye rt   Hyperplastic colon polyp    Hypertension    Hypothyroidism    Macular degeneration of left eye    Osteoporosis    fosamax 10 yrs, reclast 3 yrs   Peripheral vascular disease ([place])    blood clot behind rt eye    Primary hyperparathyroidism ([place]) 2012   PVC's (premature ventricular contractions)    history of   Scoliosis    Stage III chronic kidney disease ([place])    Thyroid nodule    Varicose veins    Past Surgical History:  Procedure Laterality Date   CATARACT EXTRACTION, BILATERAL     REVERSE SHOULDER ARTHROPLASTY Right 11/201/2014   Dr Onnie Graham   REVERSE SHOULDER ARTHROPLASTY Right 01/07/2013   Procedure: RIGHT REVERSE SHOULDER ARTHROPLASTY;  Surgeon: Marin Shutter, MD;  Location: [facility];  Service: Orthopedics;  Laterality: Right;   TOTAL HIP ARTHROPLASTY  1981,1982,2001,2007,   Right and Left     Current Meds  Medication Sig   apixaban (ELIQUIS) 2.5 MG TABS tablet Take 1 tablet (2.5 mg total) by mouth 2 (two) times daily.   calcium citrate-vitamin D (CITRACAL+D) 315-200 MG-UNIT per tablet Take 1 tablet by mouth daily.    ergocalciferol (VITAMIN D2) 50000 UNITS capsule Take 50,000 Units by mouth every 30 (thirty) days.    hydrochlorothiazide (HYDRODIURIL) 25 MG tablet Take 25 mg by mouth daily.   losartan (COZAAR) 50 MG tablet Take 50 mg by mouth daily.   metoprolol succinate (TOPROL-XL) 100 MG 24 hr tablet Take 100 mg by mouth daily.   Multiple Vitamin (MULTIVITAMIN) tablet Take 1 tablet by mouth daily.     NON FORMULARY Take 1 tablet by mouth 2 (two) times daily. Macular Degeneration Supp. Take 1 tab by mouth twice daily.   Polyvinyl Alcohol-Povidone (REFRESH OP) Apply 1 drop to eye 2 (two) times daily.     Allergies:   Amlodipine besylate; Lodine [etodolac]; Naprosyn [naproxen]; Penicillins; Pneumovax [pneumococcal polysaccharide vaccine]; Robitussin dm [dextromethorphan-guaifenesin]; and  Sulfonamide derivatives   Social History   Tobacco Use   Smoking status: Never Smoker   Smokeless tobacco: Never Used  Substance Use Topics   Alcohol use: No   Drug use: No     Family Hx: The patient's family history includes Breast cancer in her daughter; Heart attack in her brother; Lung cancer in her father and sister; Rheum arthritis in her father; Stroke in her mother.  ROS:   Please see the history of present illness.     All other systems reviewed and are negative.   Labs/Other Tests and Data Reviewed:    Recent Labs: No results found for requested labs within last 8760 hours.   Recent Lipid Panel No results found for: CHOL, TRIG, HDL, CHOLHDL, LDLCALC, LDLDIRECT  Wt Readings from Last 3 Encounters:  05/18/18 180 lb (81.6 kg)  01/01/17 166 lb (75.3 kg)  12/06/15 165 lb 6.4 oz (75 kg)     Exam:    Vital Signs:  BP 120/63    Pulse 63    Ht 5\' 3"  (1.6 m)  Wt 180 lb (81.6 kg)    BMI 31.89 kg/m    Elderly female in no acute distress. Regular work of breathing.   ASSESSMENT & PLAN:    1.  Chronic atrial fibrillation: has been rate controlled w/ metoprolol. Pulse rate today by home monitor is well controlled at 63 bpm. BP also normal at 120/63. On Eliquis for a/c. She has mild exertional dyspnea that is chronic (likely multifactorial 2/2 age, valvular disease and pulmonary HTN, with afib possibly contributing). Other than mild exertional dyspnea, she has been asymptomatic. No resting symptoms. Continue metoprolol for rate control and Eliquis for a/c.    2.  Valvular Disease, Aortic insufficiency/tricuspid regurgitation: mild AI and moderate TR noted on echo in 2018. She has stable chronic exertional dyspnea, mild. Denies resting dyspnea. Given advanced age, no plans to repeat echo.   3.  Pulmonary hypertension: echo in 9604 showed systolic pressure was mildly to moderately increased. PA peak pressure: 39 mm Hg (S). She has stable chronic exertional dyspnea,  mild. Denies resting dyspnea.   4.  Chronic anticoagulation: On Eliquis for anticoagulation.  She is on low-dose 2.5 twice daily, given age and renal function. She had a nose bleed in June 2019 that required ED visit but no recurrence since. She also denies any further bleeding. No hematuria, hematochezia or melena. Denies falls.   5.  Hypertension: on HCTZ, Losartan and metoprolol. Has CKD. Labs followed by PCP. Per BP based on home monitor is well controlled. No changes made today.   6.  Chronic kidney disease: stage III CKD. Followed by PCP.   COVID-19 Education: The signs and symptoms of COVID-19 were discussed with the patient and how to seek care for testing (follow up with PCP or arrange E-visit).  The importance of social distancing was discussed today.   Patient Risk:   After full review of this patients clinical status, I feel that they are at least moderate risk at this time.  Time:   Today, I have spent 20 minutes with the patient with telehealth technology discussing her cardiac conditions and medical management .     Medication Adjustments/Labs and Tests Ordered: Current medicines are reviewed at length with the patient today.  Concerns regarding medicines are outlined above.   Continue current plan of care. Keep medications the same. No changes made today. No labs ordered. F/u in 6 months.    Tests Ordered: No orders of the defined types were placed in this encounter. No test ordered.  Medication Changes: No orders of the defined types were placed in this encounter. No medication changes ordered.   Disposition:  Follow up 6 months w/ Dr. Meda Coffee.   Signed, Lyda Jester, PA-C  05/18/2018 12:06 PM    [facility]

## 2018-05-18 NOTE — Telephone Encounter (Signed)
Follow up:   Patient son calling to report BP 120//63 pulse 63.please call patient son any questions.

## 2018-05-21 ENCOUNTER — Ambulatory Visit: Payer: Medicare Other | Admitting: Cardiology

## 2018-06-12 DIAGNOSIS — G301 Alzheimer's disease with late onset: Secondary | ICD-10-CM | POA: Diagnosis not present

## 2018-06-12 DIAGNOSIS — I482 Chronic atrial fibrillation, unspecified: Secondary | ICD-10-CM | POA: Diagnosis not present

## 2018-06-12 DIAGNOSIS — I129 Hypertensive chronic kidney disease with stage 1 through stage 4 chronic kidney disease, or unspecified chronic kidney disease: Secondary | ICD-10-CM | POA: Diagnosis not present

## 2018-06-12 DIAGNOSIS — N183 Chronic kidney disease, stage 3 (moderate): Secondary | ICD-10-CM | POA: Diagnosis not present

## 2018-06-12 DIAGNOSIS — R0600 Dyspnea, unspecified: Secondary | ICD-10-CM | POA: Diagnosis not present

## 2018-06-12 DIAGNOSIS — E042 Nontoxic multinodular goiter: Secondary | ICD-10-CM | POA: Diagnosis not present

## 2018-06-12 DIAGNOSIS — Z1389 Encounter for screening for other disorder: Secondary | ICD-10-CM | POA: Diagnosis not present

## 2018-06-12 DIAGNOSIS — Z0000 Encounter for general adult medical examination without abnormal findings: Secondary | ICD-10-CM | POA: Diagnosis not present

## 2018-06-12 DIAGNOSIS — E039 Hypothyroidism, unspecified: Secondary | ICD-10-CM | POA: Diagnosis not present

## 2018-06-12 DIAGNOSIS — E210 Primary hyperparathyroidism: Secondary | ICD-10-CM | POA: Diagnosis not present

## 2018-06-16 ENCOUNTER — Other Ambulatory Visit: Payer: Self-pay | Admitting: Cardiology

## 2018-06-16 DIAGNOSIS — I4819 Other persistent atrial fibrillation: Secondary | ICD-10-CM

## 2018-06-18 ENCOUNTER — Other Ambulatory Visit: Payer: Self-pay | Admitting: Internal Medicine

## 2018-06-18 ENCOUNTER — Ambulatory Visit
Admission: RE | Admit: 2018-06-18 | Discharge: 2018-06-18 | Disposition: A | Discharge status: Home / Self Care | Payer: Medicare Other | Source: Ambulatory Visit | Attending: Internal Medicine | Admitting: Internal Medicine

## 2018-06-18 ENCOUNTER — Other Ambulatory Visit: Payer: Self-pay

## 2018-06-18 DIAGNOSIS — R0600 Dyspnea, unspecified: Secondary | ICD-10-CM | POA: Diagnosis not present

## 2018-06-18 DIAGNOSIS — R0609 Other forms of dyspnea: Principal | ICD-10-CM

## 2018-06-18 DIAGNOSIS — I4819 Other persistent atrial fibrillation: Secondary | ICD-10-CM | POA: Diagnosis not present

## 2018-06-18 DIAGNOSIS — R0602 Shortness of breath: Secondary | ICD-10-CM | POA: Diagnosis not present

## 2018-06-19 DIAGNOSIS — R0600 Dyspnea, unspecified: Secondary | ICD-10-CM | POA: Diagnosis not present

## 2018-06-29 DIAGNOSIS — N183 Chronic kidney disease, stage 3 (moderate): Secondary | ICD-10-CM | POA: Diagnosis not present

## 2018-06-29 DIAGNOSIS — R8290 Unspecified abnormal findings in urine: Secondary | ICD-10-CM | POA: Diagnosis not present

## 2018-06-29 DIAGNOSIS — I4819 Other persistent atrial fibrillation: Secondary | ICD-10-CM | POA: Diagnosis not present

## 2018-06-29 DIAGNOSIS — R0600 Dyspnea, unspecified: Secondary | ICD-10-CM | POA: Diagnosis not present

## 2018-07-16 DIAGNOSIS — R0600 Dyspnea, unspecified: Secondary | ICD-10-CM | POA: Diagnosis not present

## 2018-07-16 DIAGNOSIS — I4819 Other persistent atrial fibrillation: Secondary | ICD-10-CM | POA: Diagnosis not present

## 2018-07-17 ENCOUNTER — Telehealth: Payer: Self-pay | Admitting: Cardiology

## 2018-07-17 NOTE — Telephone Encounter (Signed)
New Message    [name] is calling and says the pts PC wants her seen in the office because the Pt has fluid around her heart and is SOB     Please call

## 2018-07-17 NOTE — Progress Notes (Signed)
Cardiology Office Note   Date:  07/20/2018   ID:  [name], DOB [birth year], MRN [id number]  PCP:  Lavone Orn, MD  Cardiologist:  Dr. Meda Coffee     Chief Complaint  Patient presents with  . Edema    with wt gain.       History of Present Illness: [name] is a 85 y.o. female who presents for increased edema and wt gain.   She has a hx of chronic atrial fibrillation, on anticoagulation with Eliquis  been rate controlled with metoprolol. She was referred in the past for murmur and also endorsed symptoms of fatigue and shortness of breath.  She refused to have a Lexiscan nuclear stress test but underwent an echocardiogram 10/17/16 that showed mild aortic insufficiency, moderate tricuspid regurgitation, mild pulmonary hypertension with RVSP 37 mmHg.  She also has stage III chronic kidney disease, history of PVCs, hypothyroidism and hypertension. She was last seen by Dr. Meda Coffee November 2018 and was doing well from a cardiac standpoint.  She was still living independently at that time in a condo.   Since last OV,  12/2017 pt has been doing fairly well. She still lives a home but lives with son, who helps around the house. She walks with a cane. Main limitation is knee arthritis and stable chronic exertional dyspnea, mild. her son, reported that she has dementia. Her son and daughter help her with her medications and ensures that she takes them daily. Pt has not complained of any other cardiac symptoms, other than mild exertional dyspnea. No CP, syncope/ near syncope. She has been fully compliant w/ Eliquis. She had a nose bleed in June 2019 that required ED visit but no recurrence since.  Pt's PCP called 07/17/18 concerned for increased edema and wt gain.  Pt notes it has been gradual over time.  She denies chest pain but DOE.  None at rest.  Wt is up she denies edema. She continues to walk with a cane.  Denies rapid HR.  Today HR is stable.      Past Medical History:  Diagnosis  Date  . Allergic cough   . Arthritis   . Bronchitis, chronic with acute exacerbation ([place]) 04/25/2011  . Chronic atrial fibrillation 12/13   normal EF, severe tricuspid reguritation  . Facial neuralgia    atypical  . GERD (gastroesophageal reflux disease)   . Heart murmur   . History of PSVT (paroxysmal supraventricular tachycardia)   . Hx of blood clots 60's   eye rt  . Hyperplastic colon polyp   . Hypertension   . Hypothyroidism   . Macular degeneration of left eye   . Osteoporosis    fosamax 10 yrs, reclast 3 yrs  . Peripheral vascular disease ([place])    blood clot behind rt eye   . Primary hyperparathyroidism ([place]) 2012  . PVC's (premature ventricular contractions)    history of  . Scoliosis   . Stage III chronic kidney disease ([place])   . Thyroid nodule   . Varicose veins     Past Surgical History:  Procedure Laterality Date  . CATARACT EXTRACTION, BILATERAL    . REVERSE SHOULDER ARTHROPLASTY Right 11/201/2014   Dr Onnie Graham  . REVERSE SHOULDER ARTHROPLASTY Right 01/07/2013   Procedure: RIGHT REVERSE SHOULDER ARTHROPLASTY;  Surgeon: Marin Shutter, MD;  Location: [facility];  Service: Orthopedics;  Laterality: Right;  . TOTAL HIP ARTHROPLASTY  1981,1982,2001,2007,   Right and Left     Current Outpatient Medications  Medication Sig Dispense Refill  . calcium citrate-vitamin D (CITRACAL+D) 315-200 MG-UNIT per tablet Take 1 tablet by mouth daily.     Marland Kitchen ELIQUIS 2.5 MG TABS tablet TAKE 1 TABLET(2.5 MG) BY MOUTH TWICE DAILY 180 tablet 0  . ergocalciferol (VITAMIN D2) 50000 UNITS capsule Take 50,000 Units by mouth every 30 (thirty) days.     . hydrochlorothiazide (HYDRODIURIL) 25 MG tablet Take 25 mg by mouth daily.  2  . losartan (COZAAR) 50 MG tablet Take 50 mg by mouth daily.    . metoprolol succinate (TOPROL-XL) 100 MG 24 hr tablet Take 100 mg by mouth daily.    . Multiple Vitamin (MULTIVITAMIN) tablet Take 1 tablet by mouth daily.      . NON FORMULARY Take 1 tablet by mouth 2  (two) times daily. Macular Degeneration Supp. Take 1 tab by mouth twice daily.    . Polyvinyl Alcohol-Povidone (REFRESH OP) Apply 1 drop to eye 2 (two) times daily.    . traMADol (ULTRAM) 50 MG tablet Take 50 mg by mouth as needed.     No current facility-administered medications for this visit.     Allergies:   Amlodipine besylate; Lodine [etodolac]; Naprosyn [naproxen]; Penicillins; Pneumovax [pneumococcal polysaccharide vaccine]; Robitussin dm [dextromethorphan-guaifenesin]; and Sulfonamide derivatives    Social History:  The patient  reports that she has never smoked. She has never used smokeless tobacco. She reports that she does not drink alcohol or use drugs.   Family History:  The patient's family history includes Breast cancer in her daughter; Heart attack in her brother; Lung cancer in her father and sister; Rheum arthritis in her father; Stroke in her mother.    ROS:  General:no colds or fevers, no weight changes Skin:no rashes or ulcers HEENT:no blurred vision, no congestion CV:see HPI PUL:see HPI GI:no diarrhea constipation or melena, no indigestion GU:no hematuria, no dysuria MS:no joint pain, no claudication Neuro:no syncope, no lightheadedness Endo:no diabetes, + thyroid disease  Wt Readings from Last 3 Encounters:  07/20/18 178 lb (80.7 kg)  05/18/18 180 lb (81.6 kg)  01/01/17 166 lb (75.3 kg)     PHYSICAL EXAM: VS:  BP 118/80   Pulse 74   Ht 5\' 3"  (1.6 m)   Wt 178 lb (80.7 kg)   SpO2 98%   BMI 31.53 kg/m  , BMI Body mass index is 31.53 kg/m. General:Pleasant affect, NAD Skin:Warm and dry, brisk capillary refill HEENT:normocephalic, sclera clear, mucus membranes moist Neck:supple, no JVD, no bruits  Heart:S1S2 RRR without murmur, gallup, rub or click Lungs:clear without rales, rhonchi, or wheezes WEX:HBZJ, non tender, + BS, do not palpate liver spleen or masses Ext:no lower ext edema, 2+ pedal pulses, 2+ radial pulses Neuro:alert and oriented X 3,  MAE, follows commands, + facial symmetry    EKG:  EKG is ordered today. The ekg ordered today demonstrates atrial fib rate 74, low voltage and no ST changes from 2018 or 2017.      Recent Labs: No results found for requested labs within last 8760 hours.    Lipid Panel No results found for: CHOL, TRIG, HDL, CHOLHDL, VLDL, LDLCALC, LDLDIRECT     Other studies Reviewed: Additional studies/ records that were reviewed today include: . 2D Echo 09/20/2016 Study Conclusions  - Left ventricle: The cavity size was normal. Wall thickness was increased in a pattern of mild LVH. Systolic function was normal. The estimated ejection fraction was in the range of 60% to 65%. Wall motion was normal; there were no regional  wall motion abnormalities. - Aortic valve: There was mild regurgitation. - Mitral valve: Mildly calcified annulus. There was mild regurgitation. - Right ventricle: Systolic function was mildly reduced. - Tricuspid valve: There was moderate regurgitation. - Pulmonary arteries: Systolic pressure was mildly to moderately increased. PA peak pressure: 39 mm Hg (S).   ASSESSMENT AND PLAN:  1.  Increased DOE, this has been slower increase and wt increase.  She does not eat salt.  No angina. No EKG changes.  Will recheck echo, BMP, BNP and CBC.    2.  Permanent atrial fib. Stable HR, continue Eliquis no bleeding.  3.  Vascular disease with AI mild and TR moderate with increasing dyspnea will repeat echo today.   4.  Pulmonary HTN  This may be cause of dyspnea, echo will help direct.   5.  Chronic anticoagulation on eliquis.  No bleeding  6.  HTN on HCTZ losartan and BB  Has CKD   7.  CKD-3 followed by PCP.       Addendum,  Pro BNP up somewhat, at 1000 but no old one to compare, CBC stable, and BMP up slightly at 1.89  From 1.44.   Will hold HCTZ for 2 days and give lasix 40 mg for two days then back to HCTZ.  Will see what echo shows for more complete  management.     Current medicines are reviewed with the patient today.  The patient Has no concerns regarding medicines.  The following changes have been made:  See above Labs/ tests ordered today include:see above  Disposition:   FU:  see above  Signed, Cecilie Kicks, NP  07/20/2018 8:34 AM    [address], [place], [place], [place] Phone: [phone]; Fax: [phone]

## 2018-07-17 NOTE — Telephone Encounter (Signed)
Pt PCP Dr. Laurann Montana did labs, EKG, x-ray on the pt and noted that she is retaining fluid, gaining weight, and they said she is in worsening heart failure. Dr. Laurann Montana is wanting the pt to be seen in the office by a Provider next Monday 6/1 if possible.  Scheduled the pt to see Cecilie Kicks NP for next Monday 07/20/18 at 0815.  Explained to the pts son restrictions of no visitors allowed.  covid screening questions done as mentioned below.  Son verbalized understanding and agrees with this plan.           COVID-19 Pre-Screening Questions:  . In the past 7 to 10 days have you had a cough,  shortness of breath, headache, congestion, fever (100 or greater) body aches, chills, sore throat, or sudden loss of taste or sense of smell? NO-SON DID SAY MOM HAS CHRONIC COUGH BUT HAS HAD THIS FOR YEARS . Have you been around anyone with known Covid 19. SON SAID NO . Have you been around anyone who is awaiting Covid 19 test results in the past 7 to 10 days? SON SAID NO . Have you been around anyone who has been exposed to Covid 19, or has mentioned symptoms of Covid 19 within the past 7 to 10 days? SON DID SAY NO  Son did say that his brother in law was exposed to covid end of March and 10 days later passed away.  He said his Sister was then placed on 14 day quarantine and was asymptomatic at the time and she was out of quarantine by mid April. Son said daughter may bring mother to appt on Monday at our office, but she will stay in the car and mother will come in herself. Ran this by Clinical Supervisor Janan Halter and Vida Rigger, and both agreed that this pt is allowed to come into the office to be seen for complaints mentioned and as requested by Dr Laurann Montana PCP.    If you have any concerns/questions about symptoms patients report during screening (either on the phone or at threshold). Contact the provider seeing the patient or DOD for further guidance.  If neither are available contact a  member of the leadership team.                   .

## 2018-07-20 ENCOUNTER — Other Ambulatory Visit: Payer: Self-pay

## 2018-07-20 ENCOUNTER — Ambulatory Visit (INDEPENDENT_AMBULATORY_CARE_PROVIDER_SITE_OTHER): Payer: Medicare Other | Admitting: Cardiology

## 2018-07-20 ENCOUNTER — Encounter: Payer: Self-pay | Admitting: Cardiology

## 2018-07-20 VITALS — BP 118/80 | HR 74 | Ht 63.0 in | Wt 178.0 lb

## 2018-07-20 DIAGNOSIS — N183 Chronic kidney disease, stage 3 unspecified: Secondary | ICD-10-CM

## 2018-07-20 DIAGNOSIS — I119 Hypertensive heart disease without heart failure: Secondary | ICD-10-CM

## 2018-07-20 DIAGNOSIS — R0602 Shortness of breath: Secondary | ICD-10-CM

## 2018-07-20 DIAGNOSIS — I482 Chronic atrial fibrillation, unspecified: Secondary | ICD-10-CM | POA: Diagnosis not present

## 2018-07-20 DIAGNOSIS — I272 Pulmonary hypertension, unspecified: Secondary | ICD-10-CM | POA: Diagnosis not present

## 2018-07-20 DIAGNOSIS — I38 Endocarditis, valve unspecified: Secondary | ICD-10-CM | POA: Diagnosis not present

## 2018-07-20 LAB — BASIC METABOLIC PANEL
BUN/Creatinine Ratio: 14 (ref 12–28)
BUN: 27 mg/dL (ref 10–36)
CO2: 21 mmol/L (ref 20–29)
Calcium: 10.5 mg/dL — ABNORMAL HIGH (ref 8.7–10.3)
Chloride: 103 mmol/L (ref 96–106)
Creatinine, Ser: 1.89 mg/dL — ABNORMAL HIGH (ref 0.57–1.00)
GFR calc Af Amer: 26 mL/min/{1.73_m2} — ABNORMAL LOW (ref >59)
GFR calc non Af Amer: 23 mL/min/{1.73_m2} — ABNORMAL LOW (ref >59)
Glucose: 207 mg/dL — ABNORMAL HIGH (ref 65–99)
Potassium: 4.2 mmol/L (ref 3.5–5.2)
Sodium: 143 mmol/L (ref 134–144)

## 2018-07-20 LAB — PRO B NATRIURETIC PEPTIDE: NT-Pro BNP: 1000 pg/mL — ABNORMAL HIGH (ref 0–738)

## 2018-07-20 LAB — CBC
Hematocrit: 35.2 % (ref 34.0–46.6)
Hemoglobin: 11.9 g/dL (ref 11.1–15.9)
MCH: 32.1 pg (ref 26.6–33.0)
MCHC: 33.8 g/dL (ref 31.5–35.7)
MCV: 95 fL (ref 79–97)
Platelets: 184 10*3/uL (ref 150–450)
RBC: 3.71 x10E6/uL — ABNORMAL LOW (ref 3.77–5.28)
RDW: 12.4 % (ref 11.7–15.4)
WBC: 7.8 10*3/uL (ref 3.4–10.8)

## 2018-07-20 NOTE — Patient Instructions (Addendum)
Medication Instructions:  Your physician recommends that you continue on your current medications as directed. Please refer to the Current Medication list given to you today.  If you need a refill on your cardiac medications before your next appointment, please call your pharmacy.   Lab work: TODAY:  BMET, PRO BNP, & CBC  If you have labs (blood work) drawn today and your tests are completely normal, you will receive your results only by: Marland Kitchen MyChart Message (if you have MyChart) OR . A paper copy in the mail If you have any lab test that is abnormal or we need to change your treatment, we will call you to review the results.  Testing/Procedures: Your physician has requested that you have an echocardiogram. Echocardiography is a painless test that uses sound waves to create images of your heart. It provides your doctor with information about the size and shape of your heart and how well your heart's chambers and valves are working. This procedure takes approximately one hour. There are no restrictions for this procedure.    Follow-Up: At [facility], you and your health needs are our priority.  As part of our continuing mission to provide you with exceptional heart care, we have created designated Provider Care Teams.  These Care Teams include your primary Cardiologist (physician) and Advanced Practice Providers (APPs -  Physician Assistants and Nurse Practitioners) who all work together to provide you with the care you need, when you need it. You will need a follow up appointment in 3 months with Ena Dawley, MD (IN OFFICE) or one of the following Advanced Practice Providers on your designated Care Team:   [place], PA-C Melina Copa, PA-C . Ermalinda Barrios, PA-C  Any Other Special Instructions Will Be Listed Below (If Applicable).   Echocardiogram An echocardiogram is a procedure that uses painless sound waves (ultrasound) to produce an image of the heart. Images from an  echocardiogram can provide important information about:  Signs of coronary artery disease (CAD).  Aneurysm detection. An aneurysm is a weak or damaged part of an artery wall that bulges out from the normal force of blood pumping through the body.  Heart size and shape. Changes in the size or shape of the heart can be associated with certain conditions, including heart failure, aneurysm, and CAD.  Heart muscle function.  Heart valve function.  Signs of a past heart attack.  Fluid buildup around the heart.  Thickening of the heart muscle.  A tumor or infectious growth around the heart valves. Tell a health care provider about:  Any allergies you have.  All medicines you are taking, including vitamins, herbs, eye drops, creams, and over-the-counter medicines.  Any blood disorders you have.  Any surgeries you have had.  Any medical conditions you have.  Whether you are pregnant or may be pregnant. What are the risks? Generally, this is a safe procedure. However, problems may occur, including:  Allergic reaction to dye (contrast) that may be used during the procedure. What happens before the procedure? No specific preparation is needed. You may eat and drink normally. What happens during the procedure?   An IV tube may be inserted into one of your veins.  You may receive contrast through this tube. A contrast is an injection that improves the quality of the pictures from your heart.  A gel will be applied to your chest.  A wand-like tool (transducer) will be moved over your chest. The gel will help to transmit the sound waves  from the transducer.  The sound waves will harmlessly bounce off of your heart to allow the heart images to be captured in real-time motion. The images will be recorded on a computer. The procedure may vary among health care providers and hospitals. What happens after the procedure?  You may return to your normal, everyday life, including diet,  activities, and medicines, unless your health care provider tells you not to do that. Summary  An echocardiogram is a procedure that uses painless sound waves (ultrasound) to produce an image of the heart.  Images from an echocardiogram can provide important information about the size and shape of your heart, heart muscle function, heart valve function, and fluid buildup around your heart.  You do not need to do anything to prepare before this procedure. You may eat and drink normally.  After the echocardiogram is completed, you may return to your normal, everyday life, unless your health care provider tells you not to do that. This information is not intended to replace advice given to you by your health care provider. Make sure you discuss any questions you have with your health care provider. Document Released: 02/02/2000 Document Revised: 03/09/2016 Document Reviewed: 03/09/2016 Elsevier Interactive Patient Education  2019 [employer].

## 2018-07-21 ENCOUNTER — Telehealth: Payer: Self-pay | Admitting: *Deleted

## 2018-07-21 DIAGNOSIS — Z79899 Other long term (current) drug therapy: Secondary | ICD-10-CM

## 2018-07-21 MED ORDER — FUROSEMIDE 40 MG PO TABS: ORAL_TABLET | ORAL | 0 refills | Status: DC

## 2018-07-21 NOTE — Telephone Encounter (Signed)
-----   Message from Isaiah Serge, NP sent at 07/20/2018  5:00 PM EDT ----- Have pt hold HCTZ for 2 days and take instead lasix 40 mg once daily for 2 days only then go back to the HCTZ alone. Her fluid was up some so we will use the stronger diuretic the lasix.    For those 2 days.  Her blood count was normal and lets do BMP again when she comes for echo next week.

## 2018-07-21 NOTE — Addendum Note (Signed)
Addended by: Jeremy Johann on: 07/21/2018 01:55 PM   Modules accepted: Orders

## 2018-07-23 NOTE — Addendum Note (Signed)
Addended by: Jeremy Johann on: 07/23/2018 10:51 AM   Modules accepted: Orders

## 2018-07-27 ENCOUNTER — Telehealth: Payer: Self-pay

## 2018-07-27 NOTE — Telephone Encounter (Signed)
Spoke with the patient's daughter, per DPR. She stated that her mother had lost 1 lb a day for a total of 2 lbs, then she is back to 177 lbs as of today. She still has no energy. The daughter wants to know what is next.

## 2018-07-27 NOTE — Telephone Encounter (Signed)
New message   The patient son Richardean Sale  is calling asking the nurse or CMA to call back regarding upcoming labs appt on 6/11 that he thought was done already when she was in the office to see Cecilie Kicks.

## 2018-07-27 NOTE — Telephone Encounter (Signed)
Stop the lasix and go back to HCTZ

## 2018-07-28 NOTE — Telephone Encounter (Signed)
Returned call to family, spoke to [place], [place] on file, he has been made aware to stop the Lasix and to continue HCTZ 25 mg daily.   He was very thankful for all the help his mom is receiving with her care.

## 2018-07-29 ENCOUNTER — Telehealth (HOSPITAL_COMMUNITY): Payer: Self-pay | Admitting: Radiology

## 2018-07-29 NOTE — Telephone Encounter (Signed)
COVID-19 Pre-Screening Questions:  . Do you currently have a fever? NO (yes = cancel and refer to pcp for e-visit) . Have you recently travelled on a cruise, internationally, or to [place], [place], [place], [place], [place], or [place], [place] [employer]) ?NO (yes = cancel, stay home, monitor symptoms, and contact pcp or initiate e-visit if symptoms develop) . Have you been in contact with someone that is currently pending confirmation of Covid19 testing or has been confirmed to have the [place] virus? NO(yes = cancel, stay home, away from tested individual, monitor symptoms, and contact pcp or initiate e-visit if symptoms develop) . Are you currently experiencing fatigue or cough?NO -(CHRONIC COUGH FOR YEARS)(yes = pt should be prepared to have a mask placed at the time of their visit). .  .  .  . Reiterated no additional visitors. Eartha Inch no earlier than 15 minutes before appointment time. . Please bring own mask.

## 2018-07-30 ENCOUNTER — Ambulatory Visit (HOSPITAL_COMMUNITY): Payer: Medicare Other | Attending: Cardiovascular Disease

## 2018-07-30 ENCOUNTER — Other Ambulatory Visit: Payer: Self-pay

## 2018-07-30 ENCOUNTER — Other Ambulatory Visit: Payer: Medicare Other | Admitting: *Deleted

## 2018-07-30 DIAGNOSIS — R0602 Shortness of breath: Secondary | ICD-10-CM | POA: Diagnosis not present

## 2018-07-30 DIAGNOSIS — Z79899 Other long term (current) drug therapy: Secondary | ICD-10-CM | POA: Diagnosis not present

## 2018-07-30 LAB — BASIC METABOLIC PANEL
BUN/Creatinine Ratio: 16 (ref 12–28)
BUN: 31 mg/dL (ref 10–36)
CO2: 23 mmol/L (ref 20–29)
Calcium: 10.3 mg/dL (ref 8.7–10.3)
Chloride: 105 mmol/L (ref 96–106)
Creatinine, Ser: 1.92 mg/dL — ABNORMAL HIGH (ref 0.57–1.00)
GFR calc Af Amer: 26 mL/min/{1.73_m2} — ABNORMAL LOW (ref >59)
GFR calc non Af Amer: 22 mL/min/{1.73_m2} — ABNORMAL LOW (ref >59)
Glucose: 160 mg/dL — ABNORMAL HIGH (ref 65–99)
Potassium: 4.2 mmol/L (ref 3.5–5.2)
Sodium: 145 mmol/L — ABNORMAL HIGH (ref 134–144)

## 2018-07-31 ENCOUNTER — Telehealth: Payer: Self-pay | Admitting: *Deleted

## 2018-07-31 NOTE — Telephone Encounter (Signed)
-----   Message from Isaiah Serge, NP sent at 07/31/2018 10:09 AM EDT ----- She should be back on her HCTZ and off lasix.  Please ask her if she is feeling better.  Her echo was unchanged.  I have sent to Dr. Meda Coffee for further recommendations.

## 2018-09-03 ENCOUNTER — Other Ambulatory Visit: Payer: Self-pay

## 2018-09-03 ENCOUNTER — Encounter (HOSPITAL_COMMUNITY): Payer: Self-pay

## 2018-09-03 ENCOUNTER — Emergency Department (HOSPITAL_COMMUNITY)
Admission: EM | Admit: 2018-09-03 | Discharge: 2018-09-03 | Disposition: A | Discharge status: Home / Self Care | Payer: Medicare Other | Attending: Emergency Medicine | Admitting: Emergency Medicine

## 2018-09-03 ENCOUNTER — Emergency Department (HOSPITAL_COMMUNITY): Payer: Medicare Other

## 2018-09-03 DIAGNOSIS — R197 Diarrhea, unspecified: Secondary | ICD-10-CM | POA: Insufficient documentation

## 2018-09-03 DIAGNOSIS — J42 Unspecified chronic bronchitis: Secondary | ICD-10-CM | POA: Diagnosis not present

## 2018-09-03 DIAGNOSIS — Z20828 Contact with and (suspected) exposure to other viral communicable diseases: Secondary | ICD-10-CM | POA: Insufficient documentation

## 2018-09-03 DIAGNOSIS — I482 Chronic atrial fibrillation, unspecified: Secondary | ICD-10-CM | POA: Diagnosis not present

## 2018-09-03 DIAGNOSIS — Z209 Contact with and (suspected) exposure to unspecified communicable disease: Secondary | ICD-10-CM | POA: Diagnosis not present

## 2018-09-03 DIAGNOSIS — E039 Hypothyroidism, unspecified: Secondary | ICD-10-CM | POA: Diagnosis not present

## 2018-09-03 DIAGNOSIS — E213 Hyperparathyroidism, unspecified: Secondary | ICD-10-CM | POA: Insufficient documentation

## 2018-09-03 DIAGNOSIS — R4182 Altered mental status, unspecified: Secondary | ICD-10-CM | POA: Diagnosis not present

## 2018-09-03 DIAGNOSIS — N183 Chronic kidney disease, stage 3 (moderate): Secondary | ICD-10-CM | POA: Diagnosis not present

## 2018-09-03 DIAGNOSIS — R531 Weakness: Secondary | ICD-10-CM | POA: Diagnosis not present

## 2018-09-03 DIAGNOSIS — F0390 Unspecified dementia without behavioral disturbance: Secondary | ICD-10-CM | POA: Diagnosis not present

## 2018-09-03 DIAGNOSIS — I4891 Unspecified atrial fibrillation: Secondary | ICD-10-CM | POA: Diagnosis not present

## 2018-09-03 DIAGNOSIS — Z79899 Other long term (current) drug therapy: Secondary | ICD-10-CM | POA: Insufficient documentation

## 2018-09-03 DIAGNOSIS — I10 Essential (primary) hypertension: Secondary | ICD-10-CM | POA: Diagnosis not present

## 2018-09-03 DIAGNOSIS — I129 Hypertensive chronic kidney disease with stage 1 through stage 4 chronic kidney disease, or unspecified chronic kidney disease: Secondary | ICD-10-CM | POA: Insufficient documentation

## 2018-09-03 LAB — URINALYSIS, ROUTINE W REFLEX MICROSCOPIC
Bacteria, UA: NONE SEEN
Bilirubin Urine: NEGATIVE
Glucose, UA: 150 mg/dL — AB
Hgb urine dipstick: NEGATIVE
Ketones, ur: NEGATIVE mg/dL
Nitrite: NEGATIVE
Protein, ur: NEGATIVE mg/dL
Specific Gravity, Urine: 1.011 (ref 1.005–1.030)
pH: 5 (ref 5.0–8.0)

## 2018-09-03 LAB — CBC
HCT: 37.9 % (ref 36.0–46.0)
Hemoglobin: 12.5 g/dL (ref 12.0–15.0)
MCH: 33 pg (ref 26.0–34.0)
MCHC: 33 g/dL (ref 30.0–36.0)
MCV: 100 fL (ref 80.0–100.0)
Platelets: 172 10*3/uL (ref 150–400)
RBC: 3.79 MIL/uL — ABNORMAL LOW (ref 3.87–5.11)
RDW: 12.5 % (ref 11.5–15.5)
WBC: 6.5 10*3/uL (ref 4.0–10.5)
nRBC: 0 % (ref 0.0–0.2)

## 2018-09-03 LAB — BASIC METABOLIC PANEL
Anion gap: 11 (ref 5–15)
BUN: 23 mg/dL (ref 8–23)
CO2: 27 mmol/L (ref 22–32)
Calcium: 9.7 mg/dL (ref 8.9–10.3)
Chloride: 102 mmol/L (ref 98–111)
Creatinine, Ser: 1.87 mg/dL — ABNORMAL HIGH (ref 0.44–1.00)
GFR calc Af Amer: 26 mL/min — ABNORMAL LOW (ref >60)
GFR calc non Af Amer: 23 mL/min — ABNORMAL LOW (ref >60)
Glucose, Bld: 241 mg/dL — ABNORMAL HIGH (ref 70–99)
Potassium: 3.5 mmol/L (ref 3.5–5.1)
Sodium: 140 mmol/L (ref 135–145)

## 2018-09-03 LAB — CBG MONITORING, ED: Glucose-Capillary: 217 mg/dL — ABNORMAL HIGH (ref 70–99)

## 2018-09-03 LAB — SARS CORONAVIRUS 2 BY RT PCR (HOSPITAL ORDER, PERFORMED IN ~~LOC~~ HOSPITAL LAB): SARS Coronavirus 2: NEGATIVE

## 2018-09-03 MED ORDER — SODIUM CHLORIDE 0.9 % IV BOLUS (SEPSIS)
500.0000 mL | Freq: Once | INTRAVENOUS | Status: AC
  Administered 2018-09-03: 13:00:00 500 mL via INTRAVENOUS

## 2018-09-03 MED ORDER — SODIUM CHLORIDE 0.9 % IV SOLN
1000.0000 mL | INTRAVENOUS | Status: DC
  Administered 2018-09-03: 1000 mL via INTRAVENOUS

## 2018-09-03 NOTE — ED Notes (Signed)
Discharge instructions reviewed with patient. Patient verbalizes understanding. VSS.   

## 2018-09-03 NOTE — ED Notes (Signed)
Unsuccessful IV attempt x2. IV team consult placed. ?

## 2018-09-03 NOTE — ED Triage Notes (Signed)
Patient BIB EMS from home where she lives with daughter. Patient has hx of dementia. EMS was called by daughter this morning due to patient weakness. Daughter states the patient walked to the toilet, but was too weak to get back to the bed. Patient experienced x1 episode of diarrhea and x1 episode of vomiting. Patient denying pain/nausea/vomiting in triage. Patient daughter states the patient has had a cough x2 days, but states "Its nothing new. She gets the same cough every year." Patient daughter denies patient being around anyone sick. Patient afebrile for EMS.   20G Left AC PIV placed by EMS.  EMS VS: 154/88, 80HR A-fib, 18RR, 97% RA, 222=CBG, 97.16F Temporal.

## 2018-09-03 NOTE — Discharge Instructions (Addendum)
Continue to drink plenty of fluids,  continue your medicine, follow up with your primary care doctor

## 2018-09-03 NOTE — ED Notes (Signed)
DURING ORTHOSTATICS PATIENT'S RESPIRATORY RATE WENT FROM 12-14 BREATHS A MIN TO 24 BREATHS A MINUTE. WHILE AMBULATING TO RESTROOM RESPIRATORY ROSE TO 26-28  O2 STATS REMAINED IN MID 90'S

## 2018-09-03 NOTE — ED Provider Notes (Signed)
[facility] DEPT Provider Note   CSN: [id number] Arrival date & time: 09/03/18  1141    History   Chief Complaint Chief Complaint  Patient presents with  . Weakness    HPI [name] is a 85 y.o. female.     HPI Patient presents to the emergency room for evaluation of weakness.  Patient was brought into the emergency room by ambulance from home.  She lives there with her daughter.  Patient has a history of dementia.  According to the EMS report the patient walked by herself to the toilet this morning.  She did have episode of diarrhea as well as vomiting.  She was too weak to get back to the bed and family members had to call EMS.  According to the daughter she has had a slight cough for a couple of days but that is not unusual for her.  She has not been around any sick contacts.  No known fevers.  Patient herself is not sure why she is here.  She states she feels fine and denies any complaints.  She specifically denies having trouble with chest pain, difficulty breathing, abdominal pain, fevers, numbness or weakness. Past Medical History:  Diagnosis Date  . Allergic cough   . Arthritis   . Bronchitis, chronic with acute exacerbation ([place]) 04/25/2011  . Chronic atrial fibrillation 12/13   normal EF, severe tricuspid reguritation  . Facial neuralgia    atypical  . GERD (gastroesophageal reflux disease)   . Heart murmur   . History of PSVT (paroxysmal supraventricular tachycardia)   . Hx of blood clots 60's   eye rt  . Hyperplastic colon polyp   . Hypertension   . Hypothyroidism   . Macular degeneration of left eye   . Osteoporosis    fosamax 10 yrs, reclast 3 yrs  . Peripheral vascular disease ([place])    blood clot behind rt eye   . Primary hyperparathyroidism ([place]) 2012  . PVC's (premature ventricular contractions)    history of  . Scoliosis   . Stage III chronic kidney disease ([place])   . Thyroid nodule   . Varicose veins     Patient  Active Problem List   Diagnosis Date Noted  . Hip pain 06/13/2014  . Stage III chronic kidney disease ([place]) 06/13/2014  . Essential hypertension 11/19/2013  . Atrial fibrillation ([place]) 11/19/2013  . Hypothyroidism 11/19/2013  . DOE (dyspnea on exertion) 11/19/2013  . Fatigue 11/19/2013  . Murmur 11/19/2013  . Chronic cough 07/26/2010  . Constipation 01/20/2008  . CHANGE IN BOWELS 01/20/2008  . GERD 01/19/2008  . OSTEOPOROSIS 01/19/2008    Past Surgical History:  Procedure Laterality Date  . CATARACT EXTRACTION, BILATERAL    . REVERSE SHOULDER ARTHROPLASTY Right 11/201/2014   Dr Onnie Graham  . REVERSE SHOULDER ARTHROPLASTY Right 01/07/2013   Procedure: RIGHT REVERSE SHOULDER ARTHROPLASTY;  Surgeon: Marin Shutter, MD;  Location: [facility];  Service: Orthopedics;  Laterality: Right;  . TOTAL HIP ARTHROPLASTY  1981,1982,2001,2007,   Right and Left     OB History   No obstetric history on file.      Home Medications    Prior to Admission medications   Medication Sig Start Date End Date Taking? Authorizing Provider  ELIQUIS 2.5 MG TABS tablet TAKE 1 TABLET(2.5 MG) BY MOUTH TWICE DAILY Patient taking differently: Take 2.5 mg by mouth 2 (two) times daily.  06/16/18  Yes Dorothy Spark, MD  furosemide (LASIX) 40 MG tablet  Take 40 mg by mouth daily as needed for fluid or edema.   Yes [provider]  hydrochlorothiazide (HYDRODIURIL) 25 MG tablet Take 25 mg by mouth daily. 07/03/17  Yes [provider]  levothyroxine (SYNTHROID) 100 MCG tablet Take 100 mcg by mouth daily before breakfast.   Yes [provider]  metoprolol succinate (TOPROL-XL) 100 MG 24 hr tablet Take 100 mg by mouth daily. 07/30/17  Yes [provider]  NON FORMULARY Take 1 tablet by mouth 2 (two) times daily. Macular Degeneration Supp. Take 1 tab by mouth twice daily.   Yes [provider]  Polyvinyl Alcohol-Povidone (REFRESH OP) Apply 1 drop to eye 2 (two) times daily.   Yes  [provider]  traMADol (ULTRAM) 50 MG tablet Take 50 mg by mouth every 12 (twelve) hours as needed for moderate pain.    Yes [provider]  losartan (COZAAR) 50 MG tablet Take 50 mg by mouth daily.    [provider]    Family History Family History  Problem Relation Age of Onset  . Stroke Mother   . Rheum arthritis Father   . Lung cancer Father   . Breast cancer Daughter   . Heart attack Brother   . Lung cancer Sister     Social History Social History   Tobacco Use  . Smoking status: Never Smoker  . Smokeless tobacco: Never Used  Substance Use Topics  . Alcohol use: No  . Drug use: No     Allergies   Amlodipine besylate, Lodine [etodolac], Naprosyn [naproxen], Penicillins, Pneumovax [pneumococcal polysaccharide vaccine], Robitussin dm [dextromethorphan-guaifenesin], and Sulfonamide derivatives   Review of Systems Review of Systems  All other systems reviewed and are negative.    Physical Exam Updated Vital Signs BP (!) 155/76   Pulse 75   Temp 98.6 F (37 C) (Oral)   Resp (!) 33   Ht 1.676 m (5\' 6" )   Wt 63.5 kg   SpO2 98%   BMI 22.60 kg/m   Physical Exam Vitals signs and nursing note reviewed.  Constitutional:      General: She is not in acute distress.    Appearance: She is well-developed.  HENT:     Head: Normocephalic and atraumatic.     Right Ear: External ear normal.     Left Ear: External ear normal.  Eyes:     General: No scleral icterus.       Right eye: No discharge.        Left eye: No discharge.     Conjunctiva/sclera: Conjunctivae normal.  Neck:     Musculoskeletal: Neck supple.     Trachea: No tracheal deviation.  Cardiovascular:     Rate and Rhythm: Normal rate and regular rhythm.  Pulmonary:     Effort: Pulmonary effort is normal. No respiratory distress.     Breath sounds: Normal breath sounds. No stridor. No wheezing or rales.  Abdominal:     General: Bowel sounds are normal. There is no  distension.     Palpations: Abdomen is soft.     Tenderness: There is no abdominal tenderness. There is no guarding or rebound.  Musculoskeletal:        General: No tenderness.  Skin:    General: Skin is warm and dry.     Findings: No rash.  Neurological:     Mental Status: She is alert.     Cranial Nerves: No cranial nerve deficit (no facial droop, extraocular movements intact, no  slurred speech).     Sensory: No sensory deficit.     Motor: No abnormal muscle tone or seizure activity.     Coordination: Coordination normal.     Comments: Patient is alert and follows commands, she speaks clearly, able to lift both arms and legs off the bed, normal finger-to-nose exam      ED Treatments / Results  Labs (all labs ordered are listed, but only abnormal results are displayed) Labs Reviewed  BASIC METABOLIC PANEL - Abnormal; Notable for the following components:      Result Value   Glucose, Bld 241 (*)    Creatinine, Ser 1.87 (*)    GFR calc non Af Amer 23 (*)    GFR calc Af Amer 26 (*)    All other components within normal limits  CBC - Abnormal; Notable for the following components:   RBC 3.79 (*)    All other components within normal limits  URINALYSIS, ROUTINE W REFLEX MICROSCOPIC - Abnormal; Notable for the following components:   APPearance HAZY (*)    Glucose, UA 150 (*)    Leukocytes,Ua TRACE (*)    All other components within normal limits  CBG MONITORING, ED - Abnormal; Notable for the following components:   Glucose-Capillary 217 (*)    All other components within normal limits  SARS CORONAVIRUS 2 (HOSPITAL ORDER, [facility])    EKG EKG Interpretation  Date/Time:  Thursday September 03 2018 12:18:03 EDT Ventricular Rate:  71 PR Interval:    QRS Duration: 106 QT Interval:  400 QTC Calculation: 435 R Axis:   -4 Text Interpretation:  Atrial fibrillation Low voltage, extremity and precordial leads Minimal ST depression, inferior leads No  significant change since last tracing Confirmed by Dorie Rank [phone]) on 09/03/2018 12:19:37 PM   Radiology Ct Head Wo Contrast  Result Date: 09/03/2018 CLINICAL DATA:  Altered mental status.  Weakness. EXAM: CT HEAD WITHOUT CONTRAST TECHNIQUE: Contiguous axial images were obtained from the base of the skull through the vertex without intravenous contrast. COMPARISON:  None. FINDINGS: Brain: No evidence of acute infarction, hemorrhage, mass, or extra-axial fluid collection. Diffuse cerebral volume loss. Low-density changes within the periventricular and subcortical white matter, likely sequela of chronic microvascular ischemic disease. Vascular: No hyperdense vessel or unexpected calcification. Skull: Normal. Negative for fracture or focal lesion. Sinuses/Orbits: Small polyp versus mucous retention cyst along the medial wall of the right maxillary sinus. Paranasal sinuses are otherwise clear. Mastoid air cells are clear. Other: None. IMPRESSION: No acute intracranial process. Cerebral volume loss and chronic microvascular ischemic changes. Electronically Signed   By: Davina Poke M.D.   On: 09/03/2018 13:23   Dg Chest Portable 1 View  Result Date: 09/03/2018 CLINICAL DATA:  Dementia.  Weakness. EXAM: PORTABLE CHEST 1 VIEW COMPARISON:  June 18, 2018 FINDINGS: Status post right shoulder replacement. The heart, hila, mediastinum, lungs, and pleura are otherwise stable. IMPRESSION: No active disease. Electronically Signed   By: Dorise Bullion III M.D   On: 09/03/2018 13:12    Procedures Procedures (including critical care time)  Medications Ordered in ED Medications  sodium chloride 0.9 % bolus 500 mL (0 mLs Intravenous Stopped 09/03/18 1302)    Followed by  0.9 %  sodium chloride infusion (0 mLs Intravenous Stopped 09/03/18 1600)     Initial Impression / Assessment and Plan / ED Course  I have reviewed the triage vital signs and the nursing notes.  Pertinent labs & imaging results that  were available during my care of the patient were reviewed by me and considered in my medical decision making (see chart for details).  Clinical Course as of Sep 03 1727  Thu Sep 03, 2018  1545 Patient's vital signs were normal when she was ambulating but she did become tachypneic.  O2 sat was normal.  Will do CT angiogram to make sure does not have a pulmonary embolism.  Patient has asked to go home.   [JK]  [id number] Patient has been up walking around several times in the ED.  She is not feeling weak.  She would like to go home   [JK]  1648 I did discuss the finding with the patient's daughter.  She indicated she has been having trouble with shortness of breath now for several months.  She has seen her cardiologist and they think it may be related to her cardiac conditions.   [JK]    Clinical Course User Index [JK] Dorie Rank, MD     Patient presented to the emergency room with diarrhea and weakness.  In the ED she was without specific complaints.  I did confirm the history with the daughter considering the patient's dementia.  ED work-up is reassuring.  No signs of severe dehydration.  EKG without any significant changes.  Laboratory tests are otherwise unremarkable.  Patient improved with IV fluid hydration.  She was able to walk around the emergency room.  She did have an episode of tachypnea while walking but the daughter states that has been a chronic issue for her.  I doubt acute cardiac ischemia pneumonia CHF or pulmonary malaise and.  Patient appears stable for discharge and outpatient follow-up.  Final Clinical Impressions(s) / ED Diagnoses   Final diagnoses:  Diarrhea, unspecified type  Weakness    ED Discharge Orders    None       Dorie Rank, MD 09/06/18 1721

## 2018-09-23 ENCOUNTER — Other Ambulatory Visit: Payer: Self-pay | Admitting: Cardiology

## 2018-09-23 DIAGNOSIS — I4819 Other persistent atrial fibrillation: Secondary | ICD-10-CM

## 2018-09-23 NOTE — Telephone Encounter (Signed)
Age 85, weight 63.5kg, SCr 1.92 on 07/30/18, afib indication, last OV June 2020

## 2018-10-28 DIAGNOSIS — R7989 Other specified abnormal findings of blood chemistry: Secondary | ICD-10-CM | POA: Diagnosis not present

## 2018-10-28 DIAGNOSIS — R531 Weakness: Secondary | ICD-10-CM | POA: Diagnosis not present

## 2018-11-11 DIAGNOSIS — I4821 Permanent atrial fibrillation: Secondary | ICD-10-CM | POA: Diagnosis not present

## 2018-11-11 DIAGNOSIS — E039 Hypothyroidism, unspecified: Secondary | ICD-10-CM | POA: Diagnosis not present

## 2018-11-11 DIAGNOSIS — I5030 Unspecified diastolic (congestive) heart failure: Secondary | ICD-10-CM | POA: Diagnosis not present

## 2018-11-11 DIAGNOSIS — I739 Peripheral vascular disease, unspecified: Secondary | ICD-10-CM | POA: Diagnosis not present

## 2018-11-11 DIAGNOSIS — N183 Chronic kidney disease, stage 3 (moderate): Secondary | ICD-10-CM | POA: Diagnosis not present

## 2018-11-11 DIAGNOSIS — F0390 Unspecified dementia without behavioral disturbance: Secondary | ICD-10-CM | POA: Diagnosis not present

## 2018-11-11 DIAGNOSIS — R634 Abnormal weight loss: Secondary | ICD-10-CM | POA: Diagnosis not present

## 2018-11-11 DIAGNOSIS — H3530 Unspecified macular degeneration: Secondary | ICD-10-CM | POA: Diagnosis not present

## 2018-11-11 DIAGNOSIS — I351 Nonrheumatic aortic (valve) insufficiency: Secondary | ICD-10-CM | POA: Diagnosis not present

## 2018-11-11 DIAGNOSIS — I361 Nonrheumatic tricuspid (valve) insufficiency: Secondary | ICD-10-CM | POA: Diagnosis not present

## 2018-11-11 DIAGNOSIS — I272 Pulmonary hypertension, unspecified: Secondary | ICD-10-CM | POA: Diagnosis not present

## 2018-11-11 DIAGNOSIS — I130 Hypertensive heart and chronic kidney disease with heart failure and stage 1 through stage 4 chronic kidney disease, or unspecified chronic kidney disease: Secondary | ICD-10-CM | POA: Diagnosis not present

## 2018-11-11 DIAGNOSIS — Z7901 Long term (current) use of anticoagulants: Secondary | ICD-10-CM | POA: Diagnosis not present

## 2018-11-12 DIAGNOSIS — N183 Chronic kidney disease, stage 3 (moderate): Secondary | ICD-10-CM | POA: Diagnosis not present

## 2018-11-12 DIAGNOSIS — R634 Abnormal weight loss: Secondary | ICD-10-CM | POA: Diagnosis not present

## 2018-11-12 DIAGNOSIS — I4821 Permanent atrial fibrillation: Secondary | ICD-10-CM | POA: Diagnosis not present

## 2018-11-12 DIAGNOSIS — I5030 Unspecified diastolic (congestive) heart failure: Secondary | ICD-10-CM | POA: Diagnosis not present

## 2018-11-12 DIAGNOSIS — I130 Hypertensive heart and chronic kidney disease with heart failure and stage 1 through stage 4 chronic kidney disease, or unspecified chronic kidney disease: Secondary | ICD-10-CM | POA: Diagnosis not present

## 2018-11-12 DIAGNOSIS — I351 Nonrheumatic aortic (valve) insufficiency: Secondary | ICD-10-CM | POA: Diagnosis not present

## 2018-11-13 DIAGNOSIS — N183 Chronic kidney disease, stage 3 (moderate): Secondary | ICD-10-CM | POA: Diagnosis not present

## 2018-11-13 DIAGNOSIS — I4821 Permanent atrial fibrillation: Secondary | ICD-10-CM | POA: Diagnosis not present

## 2018-11-13 DIAGNOSIS — I351 Nonrheumatic aortic (valve) insufficiency: Secondary | ICD-10-CM | POA: Diagnosis not present

## 2018-11-13 DIAGNOSIS — I130 Hypertensive heart and chronic kidney disease with heart failure and stage 1 through stage 4 chronic kidney disease, or unspecified chronic kidney disease: Secondary | ICD-10-CM | POA: Diagnosis not present

## 2018-11-13 DIAGNOSIS — R634 Abnormal weight loss: Secondary | ICD-10-CM | POA: Diagnosis not present

## 2018-11-13 DIAGNOSIS — I5030 Unspecified diastolic (congestive) heart failure: Secondary | ICD-10-CM | POA: Diagnosis not present

## 2018-11-18 DIAGNOSIS — I4821 Permanent atrial fibrillation: Secondary | ICD-10-CM | POA: Diagnosis not present

## 2018-11-18 DIAGNOSIS — I5030 Unspecified diastolic (congestive) heart failure: Secondary | ICD-10-CM | POA: Diagnosis not present

## 2018-11-18 DIAGNOSIS — R634 Abnormal weight loss: Secondary | ICD-10-CM | POA: Diagnosis not present

## 2018-11-18 DIAGNOSIS — N183 Chronic kidney disease, stage 3 (moderate): Secondary | ICD-10-CM | POA: Diagnosis not present

## 2018-11-18 DIAGNOSIS — I351 Nonrheumatic aortic (valve) insufficiency: Secondary | ICD-10-CM | POA: Diagnosis not present

## 2018-11-18 DIAGNOSIS — I130 Hypertensive heart and chronic kidney disease with heart failure and stage 1 through stage 4 chronic kidney disease, or unspecified chronic kidney disease: Secondary | ICD-10-CM | POA: Diagnosis not present

## 2018-11-19 DIAGNOSIS — Z7901 Long term (current) use of anticoagulants: Secondary | ICD-10-CM | POA: Diagnosis not present

## 2018-11-19 DIAGNOSIS — I4821 Permanent atrial fibrillation: Secondary | ICD-10-CM | POA: Diagnosis not present

## 2018-11-19 DIAGNOSIS — E039 Hypothyroidism, unspecified: Secondary | ICD-10-CM | POA: Diagnosis not present

## 2018-11-19 DIAGNOSIS — I5030 Unspecified diastolic (congestive) heart failure: Secondary | ICD-10-CM | POA: Diagnosis not present

## 2018-11-19 DIAGNOSIS — I130 Hypertensive heart and chronic kidney disease with heart failure and stage 1 through stage 4 chronic kidney disease, or unspecified chronic kidney disease: Secondary | ICD-10-CM | POA: Diagnosis not present

## 2018-11-19 DIAGNOSIS — I272 Pulmonary hypertension, unspecified: Secondary | ICD-10-CM | POA: Diagnosis not present

## 2018-11-19 DIAGNOSIS — I351 Nonrheumatic aortic (valve) insufficiency: Secondary | ICD-10-CM | POA: Diagnosis not present

## 2018-11-19 DIAGNOSIS — R634 Abnormal weight loss: Secondary | ICD-10-CM | POA: Diagnosis not present

## 2018-11-19 DIAGNOSIS — H3530 Unspecified macular degeneration: Secondary | ICD-10-CM | POA: Diagnosis not present

## 2018-11-19 DIAGNOSIS — I361 Nonrheumatic tricuspid (valve) insufficiency: Secondary | ICD-10-CM | POA: Diagnosis not present

## 2018-11-19 DIAGNOSIS — N183 Chronic kidney disease, stage 3 unspecified: Secondary | ICD-10-CM | POA: Diagnosis not present

## 2018-11-19 DIAGNOSIS — I739 Peripheral vascular disease, unspecified: Secondary | ICD-10-CM | POA: Diagnosis not present

## 2018-11-19 DIAGNOSIS — F0390 Unspecified dementia without behavioral disturbance: Secondary | ICD-10-CM | POA: Diagnosis not present

## 2018-11-24 DIAGNOSIS — I5030 Unspecified diastolic (congestive) heart failure: Secondary | ICD-10-CM | POA: Diagnosis not present

## 2018-11-24 DIAGNOSIS — I130 Hypertensive heart and chronic kidney disease with heart failure and stage 1 through stage 4 chronic kidney disease, or unspecified chronic kidney disease: Secondary | ICD-10-CM | POA: Diagnosis not present

## 2018-11-24 DIAGNOSIS — I4821 Permanent atrial fibrillation: Secondary | ICD-10-CM | POA: Diagnosis not present

## 2018-11-24 DIAGNOSIS — N183 Chronic kidney disease, stage 3 unspecified: Secondary | ICD-10-CM | POA: Diagnosis not present

## 2018-11-24 DIAGNOSIS — I351 Nonrheumatic aortic (valve) insufficiency: Secondary | ICD-10-CM | POA: Diagnosis not present

## 2018-11-24 DIAGNOSIS — R634 Abnormal weight loss: Secondary | ICD-10-CM | POA: Diagnosis not present

## 2018-11-26 DIAGNOSIS — I130 Hypertensive heart and chronic kidney disease with heart failure and stage 1 through stage 4 chronic kidney disease, or unspecified chronic kidney disease: Secondary | ICD-10-CM | POA: Diagnosis not present

## 2018-11-26 DIAGNOSIS — I351 Nonrheumatic aortic (valve) insufficiency: Secondary | ICD-10-CM | POA: Diagnosis not present

## 2018-11-26 DIAGNOSIS — N183 Chronic kidney disease, stage 3 unspecified: Secondary | ICD-10-CM | POA: Diagnosis not present

## 2018-11-26 DIAGNOSIS — I4821 Permanent atrial fibrillation: Secondary | ICD-10-CM | POA: Diagnosis not present

## 2018-11-26 DIAGNOSIS — I5030 Unspecified diastolic (congestive) heart failure: Secondary | ICD-10-CM | POA: Diagnosis not present

## 2018-11-26 DIAGNOSIS — R634 Abnormal weight loss: Secondary | ICD-10-CM | POA: Diagnosis not present

## 2018-12-03 DIAGNOSIS — N183 Chronic kidney disease, stage 3 unspecified: Secondary | ICD-10-CM | POA: Diagnosis not present

## 2018-12-03 DIAGNOSIS — I4821 Permanent atrial fibrillation: Secondary | ICD-10-CM | POA: Diagnosis not present

## 2018-12-03 DIAGNOSIS — I5030 Unspecified diastolic (congestive) heart failure: Secondary | ICD-10-CM | POA: Diagnosis not present

## 2018-12-03 DIAGNOSIS — I351 Nonrheumatic aortic (valve) insufficiency: Secondary | ICD-10-CM | POA: Diagnosis not present

## 2018-12-03 DIAGNOSIS — I130 Hypertensive heart and chronic kidney disease with heart failure and stage 1 through stage 4 chronic kidney disease, or unspecified chronic kidney disease: Secondary | ICD-10-CM | POA: Diagnosis not present

## 2018-12-03 DIAGNOSIS — R634 Abnormal weight loss: Secondary | ICD-10-CM | POA: Diagnosis not present

## 2018-12-04 DIAGNOSIS — I4821 Permanent atrial fibrillation: Secondary | ICD-10-CM | POA: Diagnosis not present

## 2018-12-04 DIAGNOSIS — I351 Nonrheumatic aortic (valve) insufficiency: Secondary | ICD-10-CM | POA: Diagnosis not present

## 2018-12-04 DIAGNOSIS — R634 Abnormal weight loss: Secondary | ICD-10-CM | POA: Diagnosis not present

## 2018-12-04 DIAGNOSIS — I5030 Unspecified diastolic (congestive) heart failure: Secondary | ICD-10-CM | POA: Diagnosis not present

## 2018-12-04 DIAGNOSIS — I130 Hypertensive heart and chronic kidney disease with heart failure and stage 1 through stage 4 chronic kidney disease, or unspecified chronic kidney disease: Secondary | ICD-10-CM | POA: Diagnosis not present

## 2018-12-04 DIAGNOSIS — N183 Chronic kidney disease, stage 3 unspecified: Secondary | ICD-10-CM | POA: Diagnosis not present

## 2018-12-09 DIAGNOSIS — I5030 Unspecified diastolic (congestive) heart failure: Secondary | ICD-10-CM | POA: Diagnosis not present

## 2018-12-09 DIAGNOSIS — I351 Nonrheumatic aortic (valve) insufficiency: Secondary | ICD-10-CM | POA: Diagnosis not present

## 2018-12-09 DIAGNOSIS — I4821 Permanent atrial fibrillation: Secondary | ICD-10-CM | POA: Diagnosis not present

## 2018-12-09 DIAGNOSIS — I130 Hypertensive heart and chronic kidney disease with heart failure and stage 1 through stage 4 chronic kidney disease, or unspecified chronic kidney disease: Secondary | ICD-10-CM | POA: Diagnosis not present

## 2018-12-09 DIAGNOSIS — R634 Abnormal weight loss: Secondary | ICD-10-CM | POA: Diagnosis not present

## 2018-12-09 DIAGNOSIS — N183 Chronic kidney disease, stage 3 unspecified: Secondary | ICD-10-CM | POA: Diagnosis not present

## 2018-12-14 DIAGNOSIS — I5030 Unspecified diastolic (congestive) heart failure: Secondary | ICD-10-CM | POA: Diagnosis not present

## 2018-12-14 DIAGNOSIS — R634 Abnormal weight loss: Secondary | ICD-10-CM | POA: Diagnosis not present

## 2018-12-14 DIAGNOSIS — I4821 Permanent atrial fibrillation: Secondary | ICD-10-CM | POA: Diagnosis not present

## 2018-12-14 DIAGNOSIS — I130 Hypertensive heart and chronic kidney disease with heart failure and stage 1 through stage 4 chronic kidney disease, or unspecified chronic kidney disease: Secondary | ICD-10-CM | POA: Diagnosis not present

## 2018-12-14 DIAGNOSIS — N183 Chronic kidney disease, stage 3 unspecified: Secondary | ICD-10-CM | POA: Diagnosis not present

## 2018-12-14 DIAGNOSIS — I351 Nonrheumatic aortic (valve) insufficiency: Secondary | ICD-10-CM | POA: Diagnosis not present

## 2018-12-15 DIAGNOSIS — I4821 Permanent atrial fibrillation: Secondary | ICD-10-CM | POA: Diagnosis not present

## 2018-12-15 DIAGNOSIS — N183 Chronic kidney disease, stage 3 unspecified: Secondary | ICD-10-CM | POA: Diagnosis not present

## 2018-12-15 DIAGNOSIS — I351 Nonrheumatic aortic (valve) insufficiency: Secondary | ICD-10-CM | POA: Diagnosis not present

## 2018-12-15 DIAGNOSIS — I130 Hypertensive heart and chronic kidney disease with heart failure and stage 1 through stage 4 chronic kidney disease, or unspecified chronic kidney disease: Secondary | ICD-10-CM | POA: Diagnosis not present

## 2018-12-15 DIAGNOSIS — R634 Abnormal weight loss: Secondary | ICD-10-CM | POA: Diagnosis not present

## 2018-12-15 DIAGNOSIS — I5030 Unspecified diastolic (congestive) heart failure: Secondary | ICD-10-CM | POA: Diagnosis not present

## 2018-12-16 DIAGNOSIS — I4821 Permanent atrial fibrillation: Secondary | ICD-10-CM | POA: Diagnosis not present

## 2018-12-16 DIAGNOSIS — R634 Abnormal weight loss: Secondary | ICD-10-CM | POA: Diagnosis not present

## 2018-12-16 DIAGNOSIS — I351 Nonrheumatic aortic (valve) insufficiency: Secondary | ICD-10-CM | POA: Diagnosis not present

## 2018-12-16 DIAGNOSIS — I130 Hypertensive heart and chronic kidney disease with heart failure and stage 1 through stage 4 chronic kidney disease, or unspecified chronic kidney disease: Secondary | ICD-10-CM | POA: Diagnosis not present

## 2018-12-16 DIAGNOSIS — I5030 Unspecified diastolic (congestive) heart failure: Secondary | ICD-10-CM | POA: Diagnosis not present

## 2018-12-16 DIAGNOSIS — N183 Chronic kidney disease, stage 3 unspecified: Secondary | ICD-10-CM | POA: Diagnosis not present

## 2018-12-17 DIAGNOSIS — N183 Chronic kidney disease, stage 3 unspecified: Secondary | ICD-10-CM | POA: Diagnosis not present

## 2018-12-17 DIAGNOSIS — I5030 Unspecified diastolic (congestive) heart failure: Secondary | ICD-10-CM | POA: Diagnosis not present

## 2018-12-17 DIAGNOSIS — I4821 Permanent atrial fibrillation: Secondary | ICD-10-CM | POA: Diagnosis not present

## 2018-12-17 DIAGNOSIS — I130 Hypertensive heart and chronic kidney disease with heart failure and stage 1 through stage 4 chronic kidney disease, or unspecified chronic kidney disease: Secondary | ICD-10-CM | POA: Diagnosis not present

## 2018-12-17 DIAGNOSIS — I351 Nonrheumatic aortic (valve) insufficiency: Secondary | ICD-10-CM | POA: Diagnosis not present

## 2018-12-17 DIAGNOSIS — R634 Abnormal weight loss: Secondary | ICD-10-CM | POA: Diagnosis not present

## 2018-12-18 DIAGNOSIS — I130 Hypertensive heart and chronic kidney disease with heart failure and stage 1 through stage 4 chronic kidney disease, or unspecified chronic kidney disease: Secondary | ICD-10-CM | POA: Diagnosis not present

## 2018-12-18 DIAGNOSIS — R634 Abnormal weight loss: Secondary | ICD-10-CM | POA: Diagnosis not present

## 2018-12-18 DIAGNOSIS — I4821 Permanent atrial fibrillation: Secondary | ICD-10-CM | POA: Diagnosis not present

## 2018-12-18 DIAGNOSIS — I351 Nonrheumatic aortic (valve) insufficiency: Secondary | ICD-10-CM | POA: Diagnosis not present

## 2018-12-18 DIAGNOSIS — I5030 Unspecified diastolic (congestive) heart failure: Secondary | ICD-10-CM | POA: Diagnosis not present

## 2018-12-18 DIAGNOSIS — N183 Chronic kidney disease, stage 3 unspecified: Secondary | ICD-10-CM | POA: Diagnosis not present

## 2018-12-19 DIAGNOSIS — I351 Nonrheumatic aortic (valve) insufficiency: Secondary | ICD-10-CM | POA: Diagnosis not present

## 2018-12-19 DIAGNOSIS — R634 Abnormal weight loss: Secondary | ICD-10-CM | POA: Diagnosis not present

## 2018-12-19 DIAGNOSIS — I4821 Permanent atrial fibrillation: Secondary | ICD-10-CM | POA: Diagnosis not present

## 2018-12-19 DIAGNOSIS — N183 Chronic kidney disease, stage 3 unspecified: Secondary | ICD-10-CM | POA: Diagnosis not present

## 2018-12-19 DIAGNOSIS — I5030 Unspecified diastolic (congestive) heart failure: Secondary | ICD-10-CM | POA: Diagnosis not present

## 2018-12-19 DIAGNOSIS — I130 Hypertensive heart and chronic kidney disease with heart failure and stage 1 through stage 4 chronic kidney disease, or unspecified chronic kidney disease: Secondary | ICD-10-CM | POA: Diagnosis not present

## 2018-12-20 DEATH — deceased

## 2020-08-02 IMAGING — CR CHEST - 2 VIEW
2 series · 2 of 2 positions shown · non-contrast
Comparison: 10/15/2016

CLINICAL DATA: Shortness of breath.

EXAM:
CHEST - 2 VIEW

[w chest pa]
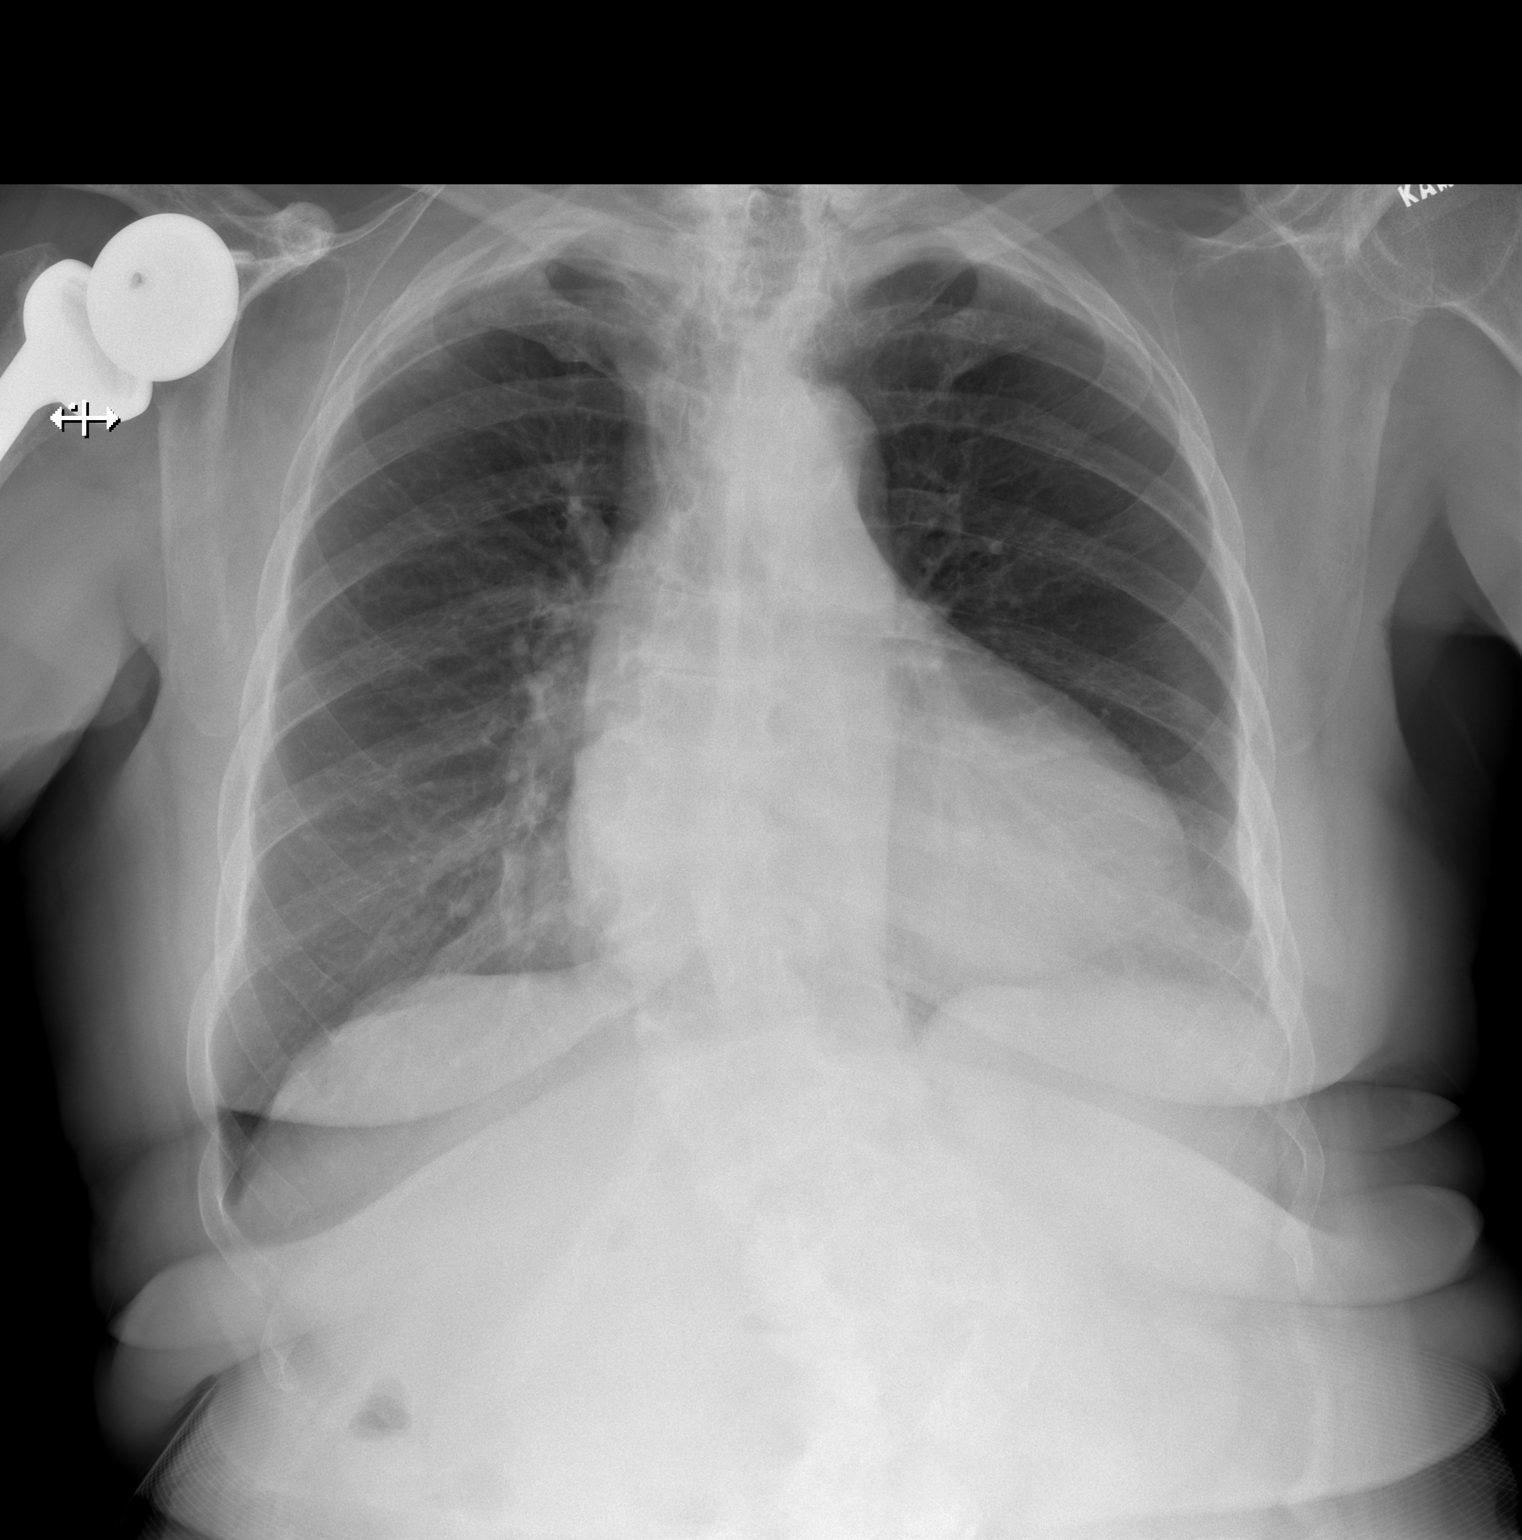

[w chest lat]
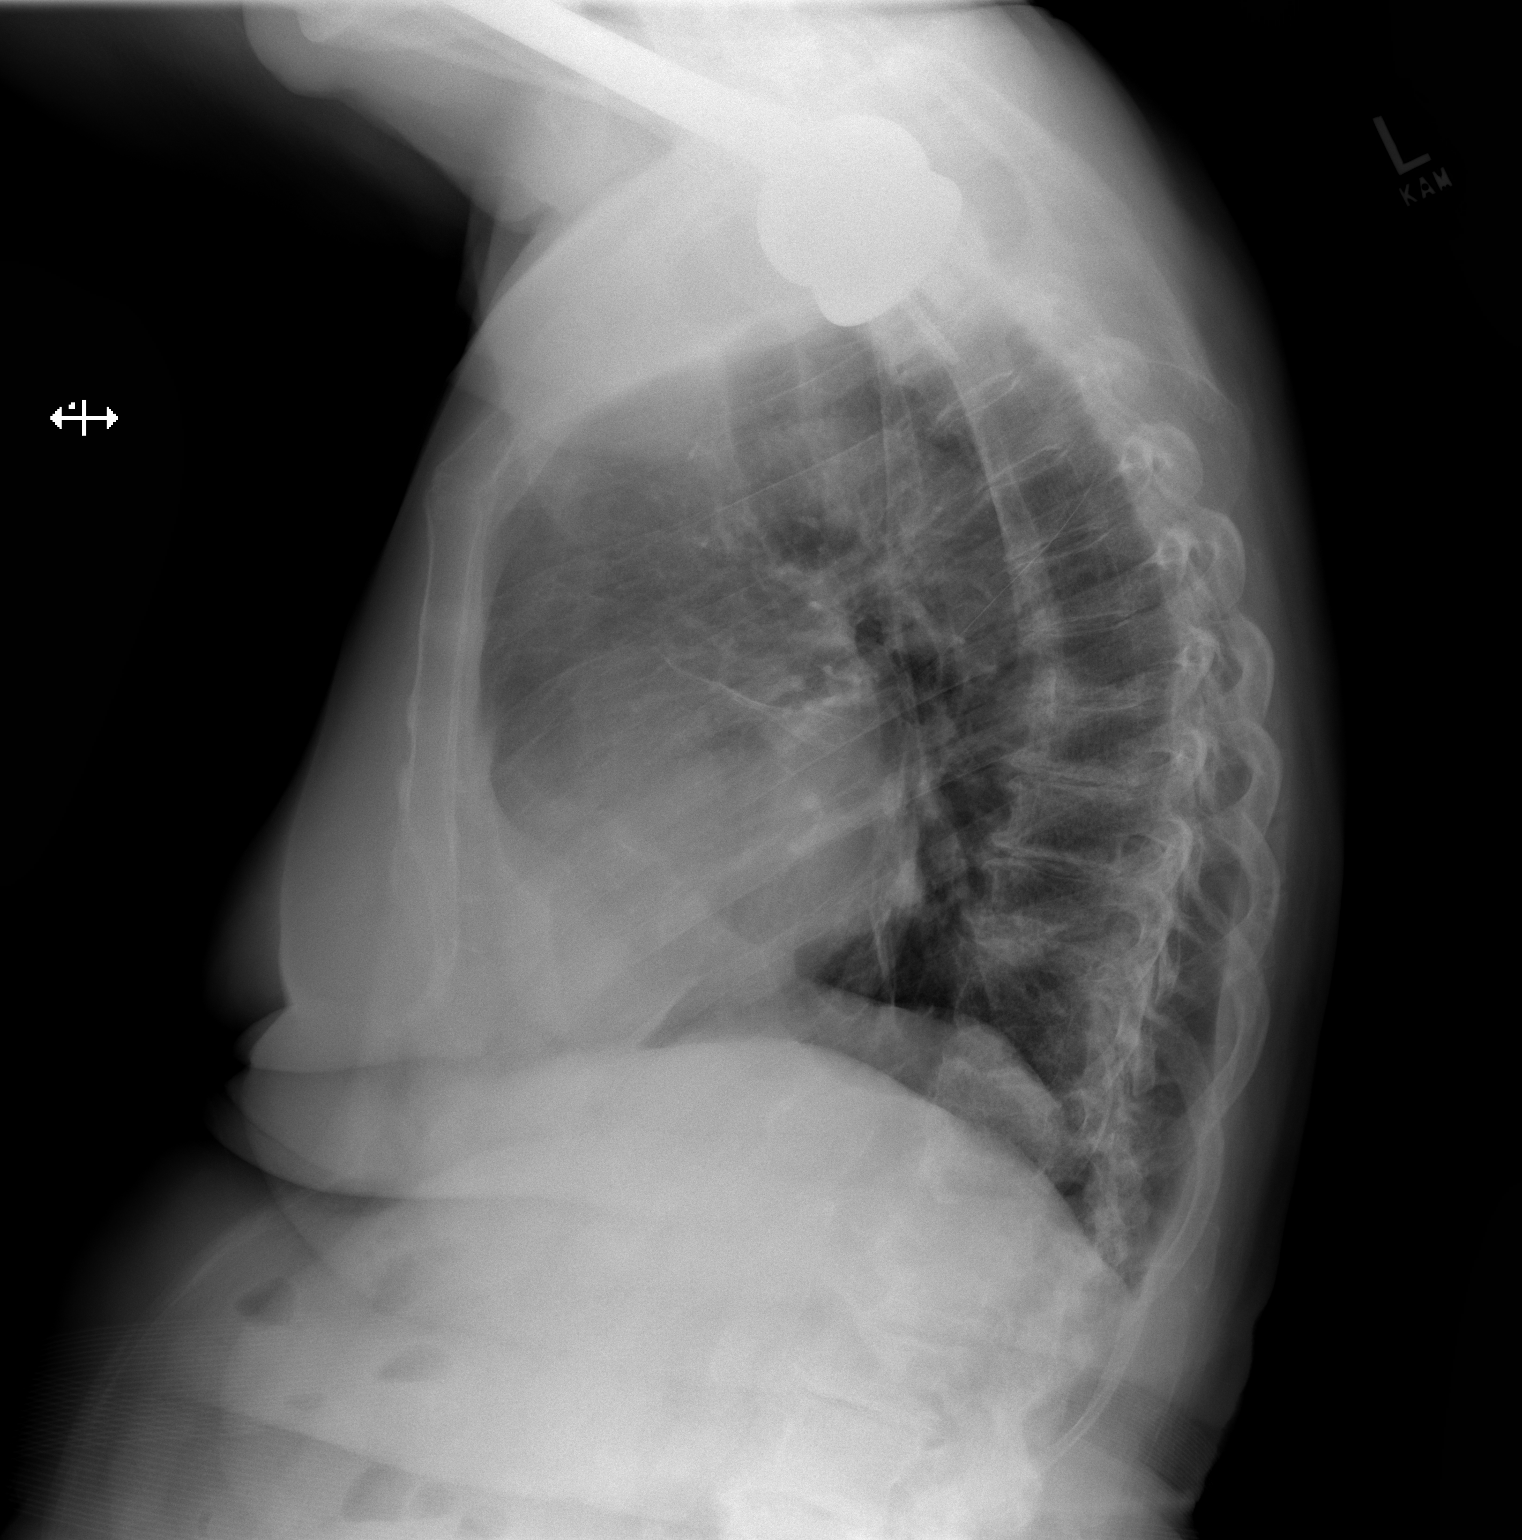

[2 of 2 positions shown; findings below may reference images not displayed]

FINDINGS: Stable top-normal heart size. There is no evidence of pulmonary
edema, consolidation, pneumothorax, nodule or pleural fluid. Stable
rightward convex scoliosis and degenerative disc disease of the
thoracic spine.
IMPRESSION: No active cardiopulmonary disease.
